# Patient Record
Sex: Female | Born: 1955 | Hispanic: No | Marital: Single | Smoking: Former smoker
Health system: Southern US, Community
[De-identification: ages and names within clinical notes are randomized; demographics above are authoritative.]

## PROBLEM LIST (undated history)

## (undated) DIAGNOSIS — E119 Type 2 diabetes mellitus without complications: Secondary | ICD-10-CM

## (undated) DIAGNOSIS — E039 Hypothyroidism, unspecified: Secondary | ICD-10-CM

## (undated) DIAGNOSIS — M1990 Unspecified osteoarthritis, unspecified site: Secondary | ICD-10-CM

## (undated) HISTORY — PX: TOTAL HIP ARTHROPLASTY: SHX124

## (undated) HISTORY — PX: TOTAL KNEE ARTHROPLASTY: SHX125

---

## 2017-11-28 ENCOUNTER — Encounter: Payer: Self-pay | Admitting: Emergency Medicine

## 2017-11-28 ENCOUNTER — Observation Stay
Admission: EM | Admit: 2017-11-28 | Discharge: 2017-11-29 | Disposition: A | Discharge status: Home / Self Care | Payer: Medicare Other | Attending: Internal Medicine | Admitting: Internal Medicine

## 2017-11-28 ENCOUNTER — Other Ambulatory Visit: Payer: Self-pay

## 2017-11-28 ENCOUNTER — Emergency Department: Payer: Medicare Other

## 2017-11-28 DIAGNOSIS — Z96653 Presence of artificial knee joint, bilateral: Secondary | ICD-10-CM | POA: Insufficient documentation

## 2017-11-28 DIAGNOSIS — Z79899 Other long term (current) drug therapy: Secondary | ICD-10-CM | POA: Insufficient documentation

## 2017-11-28 DIAGNOSIS — R079 Chest pain, unspecified: Secondary | ICD-10-CM | POA: Diagnosis present

## 2017-11-28 DIAGNOSIS — E1100 Type 2 diabetes mellitus with hyperosmolarity without nonketotic hyperglycemic-hyperosmolar coma (NKHHC): Principal | ICD-10-CM | POA: Diagnosis present

## 2017-11-28 DIAGNOSIS — E1165 Type 2 diabetes mellitus with hyperglycemia: Secondary | ICD-10-CM | POA: Diagnosis not present

## 2017-11-28 DIAGNOSIS — E039 Hypothyroidism, unspecified: Secondary | ICD-10-CM | POA: Diagnosis not present

## 2017-11-28 DIAGNOSIS — Z96643 Presence of artificial hip joint, bilateral: Secondary | ICD-10-CM | POA: Diagnosis not present

## 2017-11-28 DIAGNOSIS — Z794 Long term (current) use of insulin: Secondary | ICD-10-CM | POA: Insufficient documentation

## 2017-11-28 DIAGNOSIS — R739 Hyperglycemia, unspecified: Secondary | ICD-10-CM

## 2017-11-28 DIAGNOSIS — M25461 Effusion, right knee: Secondary | ICD-10-CM | POA: Insufficient documentation

## 2017-11-28 DIAGNOSIS — M1990 Unspecified osteoarthritis, unspecified site: Secondary | ICD-10-CM | POA: Diagnosis not present

## 2017-11-28 DIAGNOSIS — E1142 Type 2 diabetes mellitus with diabetic polyneuropathy: Secondary | ICD-10-CM | POA: Insufficient documentation

## 2017-11-28 HISTORY — DX: Type 2 diabetes mellitus without complications: E11.9

## 2017-11-28 HISTORY — DX: Unspecified osteoarthritis, unspecified site: M19.90

## 2017-11-28 HISTORY — DX: Hypothyroidism, unspecified: E03.9

## 2017-11-28 LAB — CBC WITH DIFFERENTIAL/PLATELET
BASOS PCT: 0 %
Basophils Absolute: 0 10*3/uL (ref 0–0.1)
EOS ABS: 0.2 10*3/uL (ref 0–0.7)
Eosinophils Relative: 3 %
HEMATOCRIT: 35.6 % (ref 35.0–47.0)
Hemoglobin: 11.5 g/dL — ABNORMAL LOW (ref 12.0–16.0)
Lymphocytes Relative: 24 %
Lymphs Abs: 1.7 10*3/uL (ref 1.0–3.6)
MCH: 29.5 pg (ref 26.0–34.0)
MCHC: 32.4 g/dL (ref 32.0–36.0)
MCV: 91.1 fL (ref 80.0–100.0)
MONO ABS: 0.3 10*3/uL (ref 0.2–0.9)
MONOS PCT: 4 %
NEUTROS ABS: 5.1 10*3/uL (ref 1.4–6.5)
Neutrophils Relative %: 69 %
PLATELETS: 349 10*3/uL (ref 150–440)
RBC: 3.91 MIL/uL (ref 3.80–5.20)
RDW: 17.9 % — AB (ref 11.5–14.5)
WBC: 7.3 10*3/uL (ref 3.6–11.0)

## 2017-11-28 LAB — GLUCOSE, CAPILLARY
GLUCOSE-CAPILLARY: 465 mg/dL — AB (ref 70–99)
GLUCOSE-CAPILLARY: 376 mg/dL — AB (ref 70–99)
GLUCOSE-CAPILLARY: 348 mg/dL — AB (ref 70–99)
GLUCOSE-CAPILLARY: 143 mg/dL — AB (ref 70–99)
GLUCOSE-CAPILLARY: 300 mg/dL — AB (ref 70–99)
GLUCOSE-CAPILLARY: 136 mg/dL — AB (ref 70–99)
Glucose-Capillary: >600 mg/dL (ref 70–99)
Glucose-Capillary: 524 mg/dL (ref 70–99)
Glucose-Capillary: 94 mg/dL (ref 70–99)

## 2017-11-28 LAB — BASIC METABOLIC PANEL
ANION GAP: 6 (ref 5–15)
BUN: 14 mg/dL (ref 8–23)
CALCIUM: 8.4 mg/dL — AB (ref 8.9–10.3)
CO2: 20 mmol/L — ABNORMAL LOW (ref 22–32)
Chloride: 107 mmol/L (ref 98–111)
Creatinine, Ser: 0.53 mg/dL (ref 0.44–1.00)
Glucose, Bld: 151 mg/dL — ABNORMAL HIGH (ref 70–99)
Potassium: 4.9 mmol/L (ref 3.5–5.1)
SODIUM: 133 mmol/L — AB (ref 135–145)

## 2017-11-28 LAB — URINALYSIS, COMPLETE (UACMP) WITH MICROSCOPIC
BILIRUBIN URINE: NEGATIVE
Bacteria, UA: NONE SEEN
HGB URINE DIPSTICK: NEGATIVE
Ketones, ur: 20 mg/dL — AB
Leukocytes, UA: NEGATIVE
NITRITE: NEGATIVE
PH: 7 (ref 5.0–8.0)
Protein, ur: NEGATIVE mg/dL
SPECIFIC GRAVITY, URINE: 1.018 (ref 1.005–1.030)

## 2017-11-28 LAB — COMPREHENSIVE METABOLIC PANEL
ALBUMIN: 4 g/dL (ref 3.5–5.0)
ALK PHOS: 212 U/L — AB (ref 38–126)
ALT: 21 U/L (ref 0–44)
ANION GAP: 12 (ref 5–15)
AST: 21 U/L (ref 15–41)
BILIRUBIN TOTAL: 1.4 mg/dL — AB (ref 0.3–1.2)
BUN: 22 mg/dL (ref 8–23)
CALCIUM: 9 mg/dL (ref 8.9–10.3)
CO2: 21 mmol/L — ABNORMAL LOW (ref 22–32)
CREATININE: 0.81 mg/dL (ref 0.44–1.00)
Chloride: 95 mmol/L — ABNORMAL LOW (ref 98–111)
GFR calc non Af Amer: >60 mL/min (ref >60)
GLUCOSE: 749 mg/dL — AB (ref 70–99)
Potassium: 4.7 mmol/L (ref 3.5–5.1)
Sodium: 128 mmol/L — ABNORMAL LOW (ref 135–145)
TOTAL PROTEIN: 7 g/dL (ref 6.5–8.1)

## 2017-11-28 LAB — LIPASE, BLOOD: LIPASE: 39 U/L (ref 11–51)

## 2017-11-28 LAB — BLOOD GAS, VENOUS
Acid-base deficit: 5.5 mmol/L — ABNORMAL HIGH (ref 0.0–2.0)
BICARBONATE: 19.2 mmol/L — AB (ref 20.0–28.0)
O2 SAT: 89.9 %
PATIENT TEMPERATURE: 37
PO2 VEN: 61 mmHg — AB (ref 32.0–45.0)
pCO2, Ven: 34 mmHg — ABNORMAL LOW (ref 44.0–60.0)
pH, Ven: 7.36 (ref 7.250–7.430)

## 2017-11-28 LAB — TROPONIN I
Troponin I: <0.03 ng/mL (ref <0.03)
Troponin I: 0.03 ng/mL (ref <0.03)

## 2017-11-28 LAB — MRSA PCR SCREENING: MRSA by PCR: NEGATIVE

## 2017-11-28 LAB — HEMOGLOBIN A1C
Hgb A1c MFr Bld: 9.5 % — ABNORMAL HIGH (ref 4.8–5.6)
MEAN PLASMA GLUCOSE: 225.95 mg/dL

## 2017-11-28 MED ORDER — INSULIN ASPART 100 UNIT/ML ~~LOC~~ SOLN
10.0000 [IU] | Freq: Once | SUBCUTANEOUS | Status: AC
  Administered 2017-11-28: 10 [IU] via INTRAVENOUS
  Filled 2017-11-28: qty 1

## 2017-11-28 MED ORDER — LEVOTHYROXINE SODIUM 150 MCG PO TABS
150.0000 ug | ORAL_TABLET | Freq: Every day | ORAL | Status: DC
Start: 2017-11-28 — End: 2017-11-29
  Administered 2017-11-29: 150 ug via ORAL
  Filled 2017-11-28 (×2): qty 1
  Filled 2017-11-28: qty 3

## 2017-11-28 MED ORDER — DEXTROSE 50 % IV SOLN: 25.0000 mL | INTRAVENOUS | Status: DC | PRN

## 2017-11-28 MED ORDER — ALPRAZOLAM 0.25 MG PO TABS
0.2500 mg | ORAL_TABLET | Freq: Every evening | ORAL | Status: DC | PRN
Start: 2017-11-28 — End: 2017-11-29
  Administered 2017-11-28: 0.25 mg via ORAL
  Filled 2017-11-28: qty 1

## 2017-11-28 MED ORDER — SODIUM CHLORIDE 0.9 % IV SOLN
INTRAVENOUS | Status: DC
  Administered 2017-11-28: 09:00:00 via INTRAVENOUS

## 2017-11-28 MED ORDER — INSULIN REGULAR HUMAN 100 UNIT/ML IJ SOLN
INTRAMUSCULAR | Status: DC
  Administered 2017-11-28: 0.8 [IU]/h via INTRAVENOUS
  Administered 2017-11-28: 4.6 [IU]/h via INTRAVENOUS
  Filled 2017-11-28: qty 1

## 2017-11-28 MED ORDER — ASPIRIN 325 MG PO TABS
325.0000 mg | ORAL_TABLET | Freq: Every day | ORAL | Status: DC
  Administered 2017-11-28 – 2017-11-29 (×2): 325 mg via ORAL
  Filled 2017-11-28 (×2): qty 1

## 2017-11-28 MED ORDER — ENOXAPARIN SODIUM 40 MG/0.4ML ~~LOC~~ SOLN
40.0000 mg | SUBCUTANEOUS | Status: DC
  Administered 2017-11-28: 40 mg via SUBCUTANEOUS
  Filled 2017-11-28: qty 0.4

## 2017-11-28 MED ORDER — MORPHINE SULFATE (PF) 4 MG/ML IV SOLN
4.0000 mg | Freq: Once | INTRAVENOUS | Status: AC
Start: 2017-11-28 — End: 2017-11-28
  Administered 2017-11-28: 4 mg via INTRAVENOUS
  Filled 2017-11-28: qty 1

## 2017-11-28 MED ORDER — OXYCODONE-ACETAMINOPHEN 7.5-325 MG PO TABS
1.0000 | ORAL_TABLET | ORAL | Status: DC | PRN
  Administered 2017-11-28 – 2017-11-29 (×3): 1 via ORAL
  Filled 2017-11-28 (×3): qty 1

## 2017-11-28 MED ORDER — SODIUM CHLORIDE 0.9 % IV SOLN: INTRAVENOUS | Status: DC

## 2017-11-28 MED ORDER — ACETAMINOPHEN 650 MG RE SUPP: 650.0000 mg | Freq: Four times a day (QID) | RECTAL | Status: DC | PRN

## 2017-11-28 MED ORDER — INSULIN GLARGINE 100 UNIT/ML ~~LOC~~ SOLN
33.0000 [IU] | Freq: Every day | SUBCUTANEOUS | Status: DC
Start: 2017-11-28 — End: 2017-11-29
  Administered 2017-11-28 – 2017-11-29 (×2): 33 [IU] via SUBCUTANEOUS
  Filled 2017-11-28 (×3): qty 0.33

## 2017-11-28 MED ORDER — ONDANSETRON HCL 4 MG/2ML IJ SOLN: 4.0000 mg | Freq: Four times a day (QID) | INTRAMUSCULAR | Status: DC | PRN

## 2017-11-28 MED ORDER — SODIUM CHLORIDE 0.9 % IV BOLUS
1000.0000 mL | Freq: Once | INTRAVENOUS | Status: AC
  Administered 2017-11-28: 1000 mL via INTRAVENOUS

## 2017-11-28 MED ORDER — BASAGLAR KWIKPEN 100 UNIT/ML ~~LOC~~ SOPN: 33.0000 [IU] | PEN_INJECTOR | Freq: Every day | SUBCUTANEOUS | Status: DC

## 2017-11-28 MED ORDER — PRAMIPEXOLE DIHYDROCHLORIDE 0.25 MG PO TABS
0.7500 mg | ORAL_TABLET | Freq: Two times a day (BID) | ORAL | Status: DC
  Administered 2017-11-28 – 2017-11-29 (×2): 0.75 mg via ORAL
  Filled 2017-11-28 (×2): qty 3

## 2017-11-28 MED ORDER — HYDROCODONE-ACETAMINOPHEN 5-325 MG PO TABS
1.0000 | ORAL_TABLET | ORAL | Status: DC | PRN
  Administered 2017-11-28 – 2017-11-29 (×2): 2 via ORAL
  Filled 2017-11-28 (×2): qty 2

## 2017-11-28 MED ORDER — ATORVASTATIN CALCIUM 20 MG PO TABS: 40.0000 mg | ORAL_TABLET | Freq: Every day | ORAL | Status: DC

## 2017-11-28 MED ORDER — INSULIN REGULAR BOLUS VIA INFUSION
0.0000 [IU] | Freq: Three times a day (TID) | INTRAVENOUS | Status: DC
  Administered 2017-11-28: 7.4 [IU] via INTRAVENOUS
  Filled 2017-11-28: qty 10

## 2017-11-28 MED ORDER — MORPHINE SULFATE (PF) 4 MG/ML IV SOLN
4.0000 mg | INTRAVENOUS | Status: DC | PRN
  Administered 2017-11-28: 4 mg via INTRAVENOUS
  Filled 2017-11-28: qty 1

## 2017-11-28 MED ORDER — INSULIN ASPART 100 UNIT/ML ~~LOC~~ SOLN
0.0000 [IU] | Freq: Three times a day (TID) | SUBCUTANEOUS | Status: DC
  Administered 2017-11-28: 11 [IU] via SUBCUTANEOUS
  Administered 2017-11-29: 3 [IU] via SUBCUTANEOUS
  Filled 2017-11-28 (×2): qty 1

## 2017-11-28 MED ORDER — INSULIN ASPART 100 UNIT/ML ~~LOC~~ SOLN
0.0000 [IU] | Freq: Every day | SUBCUTANEOUS | Status: DC
Start: 2017-11-28 — End: 2017-11-29

## 2017-11-28 MED ORDER — DEXTROSE-NACL 5-0.45 % IV SOLN: INTRAVENOUS | Status: DC

## 2017-11-28 MED ORDER — ONDANSETRON HCL 4 MG/2ML IJ SOLN
4.0000 mg | Freq: Once | INTRAMUSCULAR | Status: AC
  Administered 2017-11-28: 4 mg via INTRAVENOUS
  Filled 2017-11-28: qty 2

## 2017-11-28 MED ORDER — SENNOSIDES-DOCUSATE SODIUM 8.6-50 MG PO TABS: 1.0000 | ORAL_TABLET | Freq: Every evening | ORAL | Status: DC | PRN

## 2017-11-28 MED ORDER — ONDANSETRON HCL 4 MG PO TABS
4.0000 mg | ORAL_TABLET | Freq: Four times a day (QID) | ORAL | Status: DC | PRN
  Administered 2017-11-28: 4 mg via ORAL
  Filled 2017-11-28: qty 1

## 2017-11-28 MED ORDER — ACETAMINOPHEN 325 MG PO TABS: 650.0000 mg | ORAL_TABLET | Freq: Four times a day (QID) | ORAL | Status: DC | PRN

## 2017-11-28 MED ORDER — BISACODYL 5 MG PO TBEC: 5.0000 mg | DELAYED_RELEASE_TABLET | Freq: Every day | ORAL | Status: DC | PRN

## 2017-11-28 MED ORDER — ALBUTEROL SULFATE (2.5 MG/3ML) 0.083% IN NEBU: 2.5000 mg | INHALATION_SOLUTION | RESPIRATORY_TRACT | Status: DC | PRN

## 2017-11-28 NOTE — Care Management Note (Addendum)
Case Management Note  Patient Details  Name: Deanna Hartman MRN: [id number] Date of Birth: December 03, 1955  Subjective/Objective:                  RNCM met with patient as she is listed not having a PCP. She states she has recently moved from [place] to [place] with her sister.  Her PCP is in [place] and she has been able to obtain her medications without difficulty. She uses [facility] for pharmacy. She requests without preference a new PCP in [place].  FNP Philis Nettle will see patient at [facility] care on [address] On Tuesday 12/05/17 at 0845AM. Patient agrees and thanked CM for assistance. She states her sister Deanna Hartman will provide transportation for her until she can re-new her driver's license on 12/25 appointment at [facility]. She walks with a cane at baseline.  Supplemental O2 is acute. She has a working Surveyor, minerals.  Action/Plan: RNCM to follow.     Expected Discharge Date:                  Expected Discharge Plan:     In-House Referral:     Discharge planning Services     Post Acute Care Choice:    Choice offered to:     DME Arranged:    DME Agency:     HH Arranged:    HH Agency:     Status of Service:     If discussed at [employer], dates discussed:    Additional Comments:  Marshell Garfinkel, RN 11/28/2017, 10:50 AM

## 2017-11-28 NOTE — Care Management (Signed)
   Readmission Risk Prevention Plan 11/28/2017  Post Dischage Appt Complete  Medication Screening Complete  Transportation Screening Complete  PCP follow-up Complete

## 2017-11-28 NOTE — Consult Note (Signed)
Name: Deanna LordsRebecca Hartman MRN: [id number] DOB: 02/20/1956    ADMISSION DATE:  11/28/2017 CONSULTATION DATE:  11/28/2017  REFERRING MD :  Dr. Imogene Burnhen  CHIEF COMPLAINT:  Hyperglycemia, Chest/Abdominal pain  BRIEF PATIENT DESCRIPTION:  62 y.o. Female with Hyperosmolar nonketotic state   SIGNIFICANT EVENTS  11/28/17>>Admission to [facility] Stepdown unit  STUDIES:  CXR 8/13>> Shallow inspiration with linear atelectasis in the mid lungs.   HISTORY OF PRESENT ILLNESS:   Deanna Hartman is a 62 year old female past medical history as listed below, who presented to [facility] ED on 11/28/17 with complaints of chest and abdominal pain, and hyperglycemia.  She has recently moved from [place] and has not been set up with a PCP or Endocrinologist at this point.  She reports gradual onset of chest and abdominal pain for 3 to 4 days, which she says has experienced before when her sugars are elevated.  She also had complaints of urinary frequency, polyuria, and polydipsia. Initial  work-up in the ED revealed glucose 749, venous pH 7.36, CO2 34, bicarb 19.2, and Anion gap 12.  CXR with atelectasis of bilateral mid lungs.  UA with 20 ketones, no signs of UTI. Troponin has been negative x2. She received 2L NS bolus in ED. She is [facility] Step[facility] unit for treatment of Hyperosmolar nonketotic state.  PCCM is consulted for further management.  PAST MEDICAL HISTORY :   has a past medical history of Arthritis, Diabetes mellitus without complication (HCC), and Hypothyroidism.  has a past surgical history that includes Replacement total knee bilateral and Total hip arthroplasty (Bilateral). Prior to Admission medications   Medication Sig Start Date End Date Taking? Authorizing Provider  ALPRAZolam (XANAX) 0.25 MG tablet Take 0.25 mg by mouth at bedtime.    Yes [provider]  furosemide (LASIX) 40 MG tablet Take 40 mg by mouth daily.   Yes [provider]  GABAPENTIN PO Take 1-2 tablets by mouth 2 (two)  times daily. Take 1 tablet by mouth in the morning and 2 at bedtime.   Yes [provider]  insulin aspart (NOVOLOG FLEXPEN) 100 UNIT/ML FlexPen Inject 3-20 Units into the skin 3 (three) times daily with meals. Sliding scale   Yes [provider]  Insulin Glargine (BASAGLAR KWIKPEN) 100 UNIT/ML SOPN Inject 33 Units into the skin at bedtime.   Yes [provider]  levothyroxine (SYNTHROID, LEVOTHROID) 150 MCG tablet Take 150 mcg by mouth daily.    Yes [provider]  pioglitazone (ACTOS) 30 MG tablet Take 30 mg by mouth daily.    Yes [provider]  pramipexole (MIRAPEX) 0.75 MG tablet Take 1-2 tablets by mouth 2 (two) times daily. Take 1 tablet by mouth in the morning and 2 at bedtime.    Yes [provider]   No Known Allergies  FAMILY HISTORY:  family history is not on file. She was adopted. SOCIAL HISTORY:  reports that she has never smoked. She has never used smokeless tobacco. She reports that she drank alcohol. She reports that she does not use drugs.  REVIEW OF SYSTEMS:  Positives in [place] Constitutional: Negative for fever, chills, weight loss, malaise/fatigue and diaphoresis.  HENT: Negative for hearing loss, ear pain, nosebleeds, congestion, sore throat, neck pain, tinnitus and ear discharge.   Eyes: Negative for blurred vision, double vision, photophobia, pain, discharge and redness.  Respiratory: Negative for cough, hemoptysis, sputum production, +shortness of breath, wheezing and stridor.   Cardiovascular: Negative for chest pain, palpitations, orthopnea, claudication, leg swelling  and PND.  Gastrointestinal: Negative for heartburn, nausea, vomiting, abdominal pain, diarrhea, constipation, blood in stool and melena.  Genitourinary: Negative for dysuria, urgency, frequency, hematuria and flank pain.  Musculoskeletal: Negative for myalgias, back pain, joint pain and falls.  Skin: Negative for itching and rash.  Neurological:  Negative for dizziness, tingling, tremors, sensory change, speech change, focal weakness, seizures, loss of consciousness, weakness and headaches.  Endo/Heme/Allergies: Negative for environmental allergies and polydipsia. Does not bruise/bleed easily.  SUBJECTIVE:  Reports she is feeling much better, reports slight SOB Denies chest pain, N/V On Room air IV insulin and NS infusions  VITAL SIGNS: Temp:  [98 F (36.7 C)-98.4 F (36.9 C)] 98.4 F (36.9 C) (08/13 0858) Pulse Rate:  [96-112] 109 (08/13 1100) Resp:  [17-24] 17 (08/13 1100) BP: (132-160)/(65-75) 148/71 (08/13 0858) SpO2:  [100 %] 100 % (08/13 0830) Weight:  [71.7 kg-75.5 kg] 75.5 kg (08/13 0858)  PHYSICAL EXAMINATION: General:  Acutely ill appearing female, laying in bed, on room air, in NAD Neuro:  Awake, A&O x4, Follows commands, no focal deficits HEENT:  Atraumatic, normocephalic, neck supple, no JVD Cardiovascular:  RRR, s1s2, no M/R/G, 2+ pulses throughout Lungs:  Clear bilaterally, even, non-labored, normal effort Abdomen:  Soft, non-tender, non-distended, BS+ x4 Musculoskeletal:  No deformities Skin:  Warm, dry.  No obvious rashes, lesions, or ulcerations  Recent Labs  Lab 11/28/17 0628  NA 128*  K 4.7  CL 95*  CO2 21*  BUN 22  CREATININE 0.81  GLUCOSE 749*   Recent Labs  Lab 11/28/17 0628  HGB 11.5*  HCT 35.6  WBC 7.3  PLT 349   Dg Chest Portable 1 View  Result Date: 11/28/2017 CLINICAL DATA:  High blood sugar and abdominal pain. EXAM: PORTABLE CHEST 1 VIEW COMPARISON:  None. FINDINGS: Shallow inspiration. Linear atelectasis in the mid lungs. No consolidation or airspace disease. No blunting of costophrenic angles. No pneumothorax. Normal heart size and pulmonary vascularity. Calcification of the aorta. IMPRESSION: Shallow inspiration with linear atelectasis in the mid lungs. Electronically Signed   By: Deanna NievesWilliam  Hartman M.D.   On: 11/28/2017 06:39    ASSESSMENT / PLAN:  A: Hyperosmolar  nonketotic state in setting of DM II Chest pain Hx:DM II, hypothyroidism, arthritis P: Aggressive IVF IV insulin gtt Keep NPO Cardiac monitoring Trend Troponin Continue ASA and Lipitor Needs to be set up with Endocrinology outpatient Continue home synthroid   Updates: Updated pt at bedside 11/28/17. Disposition: Stepdown VTE Prophylaxis: Lovenox SUP: n/a   Harlon DittyJeremiah Chareese Sergent, AGACNP-BC [facility] Pager: [phone]   11/28/2017, 11:30 AM

## 2017-11-28 NOTE — H&P (Addendum)
[facility] [place] [facility]   PATIENT NAME: Deanna LordsRebecca Hartman    MR#:  [id number]  DATE OF BIRTH:  11/22/1955  DATE OF ADMISSION:  11/28/2017  PRIMARY CARE PHYSICIAN: Patient, No Pcp Per   REQUESTING/REFERRING PHYSICIAN: Dionne BucySiadecki, Sebastian, MD  CHIEF COMPLAINT:   Chief Complaint  Patient presents with  . Hyperglycemia  . Abdominal Pain   Abdominal pain and hyperglycemia. HISTORY OF PRESENT ILLNESS:  Deanna Hartman  is a 62 y.o. female with a known history of diabetes, DKA, arthritis and hypothyroidism.  The patient complains of gradual onset of hypoglycemia and abdominal pain for 1 to 2 days.  He also complains of chest pain, which is aching, intermittent and radiation to left shoulder.  She also complains of urinary frequency, polyuria polydipsia.  She said she had similar symptoms during her DKA episode.  She was found hypoglycemia with glucose at 749.  But anion gap is 12.  She is being treated with normal saline bolus and given NovoLog 2 units IV just now.  PAST MEDICAL HISTORY:   Past Medical History:  Diagnosis Date  . Arthritis   . Diabetes mellitus without complication (HCC)   . Hypothyroidism     PAST SURGICAL HISTORY:   Past Surgical History:  Procedure Laterality Date  . REPLACEMENT TOTAL KNEE BILATERAL    . TOTAL HIP ARTHROPLASTY Bilateral     SOCIAL HISTORY:   Social History   Tobacco Use  . Smoking status: Never Smoker  . Smokeless tobacco: Never Used  Substance Use Topics  . Alcohol use: Not Currently    Frequency: Never    FAMILY HISTORY:   Family History  Adopted: Yes    DRUG ALLERGIES:  No Known Allergies  REVIEW OF SYSTEMS:   Review of Systems  Constitutional: Positive for malaise/fatigue. Negative for chills and fever.  HENT: Negative for sore throat.   Eyes: Negative for blurred vision and double vision.  Respiratory: Negative for cough, hemoptysis, shortness of breath, wheezing and stridor.     Cardiovascular: Positive for chest pain. Negative for palpitations, orthopnea and leg swelling.  Gastrointestinal: Positive for abdominal pain and nausea. Negative for blood in stool, diarrhea, melena and vomiting.  Genitourinary: Positive for frequency. Negative for dysuria, flank pain and hematuria.  Musculoskeletal: Negative for back pain and joint pain.  Skin: Negative for rash.  Neurological: Negative for dizziness, sensory change, focal weakness, seizures, loss of consciousness, weakness and headaches.  Endo/Heme/Allergies: Positive for polydipsia.  Psychiatric/Behavioral: Negative for depression. The patient is nervous/anxious.     MEDICATIONS AT HOME:   Prior to Admission medications   Not on File      VITAL SIGNS:  Blood pressure (!) 157/73, pulse (!) 112, temperature 98 F (36.7 C), temperature source Oral, resp. rate 20, height 5\' 3"  (1.6 m), weight 71.7 kg, SpO2 100 %.  PHYSICAL EXAMINATION:  Physical Exam  GENERAL:  62 y.o.-year-old patient lying in the bed with no acute distress.  EYES: Pupils equal, round, reactive to light and accommodation. No scleral icterus. Extraocular muscles intact.  HEENT: Head atraumatic, normocephalic. Oropharynx and nasopharynx clear.  NECK:  Supple, no jugular venous distention. No thyroid enlargement, no tenderness.  LUNGS: Normal breath sounds bilaterally, no wheezing, rales,rhonchi or crepitation. No use of accessory muscles of respiration.  CARDIOVASCULAR: S1, S2 normal. No murmurs, rubs, or gallops.  ABDOMEN: Soft, nontender, nondistended. Bowel sounds present. No organomegaly or mass.  EXTREMITIES: No pedal edema, cyanosis, or clubbing.  NEUROLOGIC: Cranial nerves  II through XII are intact. Muscle strength 5/5 in all extremities. Sensation intact. Gait not checked.  PSYCHIATRIC: The patient is alert and oriented x 3.  SKIN: No obvious rash, lesion, or ulcer.   LABORATORY PANEL:   CBC Recent Labs  Lab 11/28/17 0628  WBC 7.3   HGB 11.5*  HCT 35.6  PLT 349   ------------------------------------------------------------------------------------------------------------------  Chemistries  Recent Labs  Lab 11/28/17 0628  NA 128*  K 4.7  CL 95*  CO2 21*  GLUCOSE 749*  BUN 22  CREATININE 0.81  CALCIUM 9.0  AST 21  ALT 21  ALKPHOS 212*  BILITOT 1.4*   ------------------------------------------------------------------------------------------------------------------  Cardiac Enzymes Recent Labs  Lab 11/28/17 0628  TROPONINI <0.03   ------------------------------------------------------------------------------------------------------------------  RADIOLOGY:  Dg Chest Portable 1 View  Result Date: 11/28/2017 CLINICAL DATA:  High blood sugar and abdominal pain. EXAM: PORTABLE CHEST 1 VIEW COMPARISON:  None. FINDINGS: Shallow inspiration. Linear atelectasis in the mid lungs. No consolidation or airspace disease. No blunting of costophrenic angles. No pneumothorax. Normal heart size and pulmonary vascularity. Calcification of the aorta. IMPRESSION: Shallow inspiration with linear atelectasis in the mid lungs. Electronically Signed   By: Burman NievesWilliam  Stevens M.D.   On: 11/28/2017 06:39      IMPRESSION AND PLAN:   Hyperosmolar nonketotic state with DM T2. The patient will be admitted to stepdown unit. Start insulin drip, n.p.o. except medication, follow up intensivist. Resume diabetes diet on the Lantus once blood sugar is controlled.  Chest pain.  Follow-up troponin level, aspirin and Lipitor, check lipid panel.  Discussed with  Dr. Sung AmabileSimonds. All the records are reviewed and case discussed with ED provider. Management plans discussed with the patient, family and they are in agreement.  CODE STATUS: Full code  TOTAL CRITICAL TIME TAKING CARE OF THIS PATIENT: 46 minutes.    Shaune PollackQing Josely Moffat M.D on 11/28/2017 at 7:23 AM  Between 7am to 6pm - Pager - [phone]  After 6pm go to [email or web address] - [email or web address]  [facility]  [facility]  Office  [phone]  CC: Primary care physician; Patient, No Pcp Per   Note: This dictation was prepared with Dragon dictation along with smaller phrase technology. Any transcriptional errors that result from this process are unin

## 2017-11-28 NOTE — Significant Event (Signed)
Off insulin gtt. Transfer to MedSurg floor with cardiac monitoring. After transfer, PCCM will sign off. Please call if we can be of further assistance    Billy Fischeravid Simonds, MD PCCM service Mobile [phone] Pager [phone] 11/28/2017 3:47 PM

## 2017-11-28 NOTE — Progress Notes (Signed)
Pt cbg 94, glucostablizer states to hold insulin gtt. NP notified to order lantus. Will carry out orders and continue to monitor.

## 2017-11-28 NOTE — ED Triage Notes (Signed)
Pt arrived to the ED from home for complaints of having high BS and abdominal pain. Pt reports that she believes that she is in DKA. Pt is AOx4 in moderate pain.
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
## 2017-11-28 NOTE — Progress Notes (Signed)
Inpatient Diabetes Program Recommendations  AACE/ADA: New Consensus Statement on Inpatient Glycemic Control (2015)  Target Ranges:  Prepandial:   less than 140 mg/dL      Peak postprandial:   less than 180 mg/dL (1-2 hours)      Critically ill patients:  140 - 180 mg/dL   Results for Deanna Hartman, Deanna Hartman (MRN [id number]) as of 11/28/2017 09:06  Ref. Range 11/28/2017 06:24 11/28/2017 08:00 11/28/2017 08:59  Glucose-Capillary Latest Ref Range: 70 - 99 mg/dL >295>600 (HH)  10 units Novolog  465 (H) 376 (H)    Admit: Hyperglycemia/ Abd Pain  History: DM  Home DM Meds: Basaglar 33 units QHS        Novolog 3-20 units TID per SSI       Actos 30 mg daily  Current Orders: IV Insulin Drip       Patient admitted with glucose 749 mg/dl (CO2 was 21 and Anion Gap was 12 on 6:30am BMET).  Given NS IVF boluses in the ED and IV Insulin drip to start this AM.  Current Hemoglobin A1c level pending.     --Will follow patient during hospitalization--  Ambrose FinlandJeannine Johnston Salah Nakamura RN, MSN, CDE Diabetes Coordinator Inpatient Glycemic Control Team Team Pager: [phone] (8a-5p)

## 2017-11-29 DIAGNOSIS — R739 Hyperglycemia, unspecified: Secondary | ICD-10-CM | POA: Diagnosis present

## 2017-11-29 LAB — GLUCOSE, CAPILLARY
GLUCOSE-CAPILLARY: 121 mg/dL — AB (ref 70–99)
GLUCOSE-CAPILLARY: 186 mg/dL — AB (ref 70–99)

## 2017-11-29 LAB — BASIC METABOLIC PANEL
ANION GAP: 8 (ref 5–15)
BUN: 14 mg/dL (ref 8–23)
CO2: 25 mmol/L (ref 22–32)
CREATININE: 0.6 mg/dL (ref 0.44–1.00)
Calcium: 8.7 mg/dL — ABNORMAL LOW (ref 8.9–10.3)
Chloride: 104 mmol/L (ref 98–111)
GLUCOSE: 171 mg/dL — AB (ref 70–99)
Potassium: 4.5 mmol/L (ref 3.5–5.1)
Sodium: 137 mmol/L (ref 135–145)

## 2017-11-29 LAB — CBC
HCT: 31.8 % — ABNORMAL LOW (ref 35.0–47.0)
HEMOGLOBIN: 10.5 g/dL — AB (ref 12.0–16.0)
MCH: 29.2 pg (ref 26.0–34.0)
MCHC: 33.1 g/dL (ref 32.0–36.0)
MCV: 88.3 fL (ref 80.0–100.0)
PLATELETS: 329 10*3/uL (ref 150–440)
RBC: 3.6 MIL/uL — ABNORMAL LOW (ref 3.80–5.20)
RDW: 16.8 % — ABNORMAL HIGH (ref 11.5–14.5)
WBC: 4.4 10*3/uL (ref 3.6–11.0)

## 2017-11-29 LAB — LIPID PANEL
Cholesterol: 202 mg/dL — ABNORMAL HIGH (ref 0–200)
HDL: 98 mg/dL (ref >40)
LDL Cholesterol: 66 mg/dL (ref 0–99)
TRIGLYCERIDES: 190 mg/dL — AB (ref <150)
Total CHOL/HDL Ratio: 2.1 RATIO
VLDL: 38 mg/dL (ref 0–40)

## 2017-11-29 LAB — TROPONIN I: Troponin I: <0.03 ng/mL (ref <0.03)

## 2017-11-29 LAB — HIV ANTIBODY (ROUTINE TESTING W REFLEX): HIV SCREEN 4TH GENERATION: NONREACTIVE

## 2017-11-29 MED ORDER — TRAMADOL HCL 50 MG PO TABS: 50.0000 mg | ORAL_TABLET | Freq: Four times a day (QID) | ORAL | 0 refills | Status: DC | PRN

## 2017-11-29 MED ORDER — FUROSEMIDE 40 MG PO TABS: 40.0000 mg | ORAL_TABLET | Freq: Every day | ORAL | 0 refills | Status: AC | PRN

## 2017-11-29 MED ORDER — BASAGLAR KWIKPEN 100 UNIT/ML ~~LOC~~ SOPN: 33.0000 [IU] | PEN_INJECTOR | Freq: Every day | SUBCUTANEOUS | 1 refills | Status: DC

## 2017-11-29 NOTE — Care Management CC44 (Signed)
Condition Code 44 Documentation Completed  Patient Details  Name: Dara LordsRebecca Galindez MRN: [id number] Date of Birth: 01/25/1956   Condition Code 44 given:  Yes Patient signature on Condition Code 44 notice:  Yes Documentation of 2 MD's agreement:  Yes Code 44 added to claim:  Yes    Eber HongGreene, Nadezhda Pollitt R, RN 11/29/2017, 9:12 AM

## 2017-11-29 NOTE — Discharge Summary (Signed)
[facility] - [place] at [facility]   PATIENT NAME: Deanna Hartman    MR#:  [id number]  DATE OF BIRTH:  12/25/1955  DATE OF ADMISSION:  11/28/2017   ADMITTING PHYSICIAN: Shaune PollackQing Chen, MD  DATE OF DISCHARGE: 11/29/2017 10:02 AM  PRIMARY CARE PHYSICIAN: Patient, No Pcp Per   ADMISSION DIAGNOSIS:   Hyperglycemia [R73.9] Chest pain, unspecified type [R07.9]  DISCHARGE DIAGNOSIS:   Active Problems:   Hyperosmolar non-ketotic state in patient with type 2 diabetes mellitus (HCC)   Hyperglycemia   SECONDARY DIAGNOSIS:   Past Medical History:  Diagnosis Date  . Arthritis   . Diabetes mellitus without complication (HCC)   . Hypothyroidism     HOSPITAL COURSE:   62 year old female with past medical history significant for diabetes mellitus, osteoarthritis and hypothyroidism was admitted to hospital secondary to DKA  1.  Diabetic ketoacidosis-blood sugars greater than 700 on admission.  Patient states she lost her wallet including her basal insulin about a month ago.  [place] ago.  Has been only using her NovoLog insulin.  Anion gap was 12 -  placed on insulin drip and was in ICU. -A1c is 9.5 -Restarted on Lantus FlexPen 33 units and continue NovoLog per sliding scale.  Strongly recommended to establish a physician especially an endocrinologist -Patient also on Actos at home.  2.  Hypothyroidism-continue Synthroid  3.  Osteoarthritis-status post left knee TKR, now right knee is swollen and has been giving her problems for a while.  Recommended outpatient Ortho follow-up  4.  Peripheral neuropathy-on gabapentin  Patient has been able to ambulate with a cane at baseline.  Will be discharged home.   DISCHARGE CONDITIONS:   Guarded  CONSULTS OBTAINED:   Treatment Team:  Enid BaasKalisetti, Dahl Higinbotham, MD  DRUG ALLERGIES:   No Known Allergies DISCHARGE MEDICATIONS:   Allergies as of 11/29/2017   No Known  Allergies     Medication List    TAKE these medications   ACTOS 30 MG tablet Generic drug:  pioglitazone Take 30 mg by mouth daily.   ALPRAZolam 0.25 MG tablet Commonly known as:  XANAX Take 0.25 mg by mouth at bedtime.   BASAGLAR KWIKPEN 100 UNIT/ML Sopn Inject 0.33 mLs (33 Units total) into the skin at bedtime.   furosemide 40 MG tablet Commonly known as:  LASIX Take 1 tablet (40 mg total) by mouth daily as needed. What changed:    when to take this  reasons to take this   GABAPENTIN PO Take 1-2 tablets by mouth 2 (two) times daily. Take 1 tablet by mouth in the morning and 2 at bedtime.   levothyroxine 150 MCG tablet Commonly known as:  SYNTHROID, LEVOTHROID Take 150 mcg by mouth daily.   NOVOLOG FLEXPEN 100 UNIT/ML FlexPen Generic drug:  insulin aspart Inject 3-20 Units into the skin 3 (three) times daily with meals. Sliding scale   pramipexole 0.75 MG tablet Commonly known as:  MIRAPEX Take 1-2 tablets by mouth 2 (two) times daily. Take 1 tablet by mouth in the morning and 2 at bedtime.   traMADol 50 MG tablet Commonly known as:  ULTRAM Take 1 tablet (50 mg total) by mouth every 6 (six) hours as needed for up to 5 days.        DISCHARGE INSTRUCTIONS:   1. Endocrinology f/u in 1 week 2. PCP f/u in 1 week 3. Orthopedics f/u in 2-3 weeks for right knee osteoarthritis  DIET:  Cardiac diet and Diabetic diet  ACTIVITY:   Activity as tolerated  OXYGEN:   Home Oxygen: No.  Oxygen Delivery: room air  DISCHARGE LOCATION:   home   If you experience worsening of your admission symptoms, develop shortness of breath, life threatening emergency, suicidal or homicidal thoughts you must seek medical attention immediately by calling 911 or calling your MD immediately  if symptoms less severe.  You Must read complete instructions/literature along with all the possible adverse reactions/side effects for all the Medicines you take and that have been  prescribed to you. Take any new Medicines after you have completely understood and accpet all the possible adverse reactions/side effects.   Please note  You were cared for by a hospitalist during your hospital stay. If you have any questions about your discharge medications or the care you received while you were in the hospital after you are discharged, you can call the unit and asked to speak with the hospitalist on call if the hospitalist that took care of you is not available. Once you are discharged, your primary care physician will handle any further medical issues. Please note that NO REFILLS for any discharge medications will be authorized once you are discharged, as it is imperative that you return to your primary care physician (or establish a relationship with a primary care physician if you do not have one) for your aftercare needs so that they can reassess your need for medications and monitor your lab values.    On the day of Discharge:  VITAL SIGNS:   Blood pressure 109/60, pulse 80, temperature 98.4 F (36.9 C), temperature source Oral, resp. rate 18, height 5\' 3"  (1.6 m), weight 75.5 kg, SpO2 96 %.  PHYSICAL EXAMINATION:    GENERAL:  62 y.o.-year-old patient lying in the bed with no acute distress.  EYES: Pupils equal, round, reactive to light and accommodation. No scleral icterus. Extraocular muscles intact.  HEENT: Head atraumatic, normocephalic. Oropharynx and nasopharynx clear.  NECK:  Supple, no jugular venous distention. No thyroid enlargement, no tenderness.  LUNGS: Normal breath sounds bilaterally, no wheezing, rales,rhonchi or crepitation. No use of accessory muscles of respiration.  Decreased bibasilar breath sounds CARDIOVASCULAR: S1, S2 normal. No rubs, or gallops.  2/6 systolic murmur is present ABDOMEN: Soft, non-tender, non-distended. Bowel sounds present. No organomegaly or mass.  EXTREMITIES: No pedal edema, cyanosis, or clubbing.  Right knee swelling and  tenderness noted NEUROLOGIC: Cranial nerves II through XII are intact. Muscle strength 5/5 in all extremities. Sensation intact. Gait not checked.  PSYCHIATRIC: The patient is alert and oriented x 3.  SKIN: No obvious rash, lesion, or ulcer.   DATA REVIEW:   CBC Recent Labs  Lab 11/29/17 0544  WBC 4.4  HGB 10.5*  HCT 31.8*  PLT 329    Chemistries  Recent Labs  Lab 11/28/17 0628  11/29/17 0544  NA 128*   < > 137  K 4.7   < > 4.5  CL 95*   < > 104  CO2 21*   < > 25  GLUCOSE 749*   < > 171*  BUN 22   < > 14  CREATININE 0.81   < > 0.60  CALCIUM 9.0   < > 8.7*  AST 21  --   --   ALT 21  --   --   ALKPHOS 212*  --   --   BILITOT 1.4*  --   --    < > = values in this  interval not displayed.     Microbiology Results  Results for orders placed or performed during the hospital encounter of 11/28/17  MRSA PCR Screening     Status: None   Collection Time: 11/28/17  7:08 PM  Result Value Ref Range Status   MRSA by PCR NEGATIVE NEGATIVE Final    Comment:        The GeneXpert MRSA Assay (FDA approved for NASAL specimens only), is one component of a comprehensive MRSA colonization surveillance program. It is not intended to diagnose MRSA infection nor to guide or monitor treatment for MRSA infections. Performed at [facility], [address] [place], [place] [address]     RADIOLOGY:  No results found.   Management plans discussed with the patient, family and they are in agreement.  CODE STATUS:  Code Status History    Date Active Date Inactive Code Status Order ID Comments User Context   11/28/2017 0857 11/29/2017 1307 Full Code [id number]  Shaune Pollackhen, Qing, MD Inpatient      TOTAL TIME TAKING CARE OF THIS PATIENT: 38 minutes.    Enid BaasKALISETTI,Takara Sermons M.D on 11/29/2017 at 4:12 PM  Between 7am to 6pm - Pager - [phone]  After 6pm go to [email or web address] - Social research officer, governmentpassword EPAS [email or web address]  [facility] [place] [facility]  Office  [phone]  CC: Primary care  physician; Patient, No Pcp Per   Note: This dictation was prepared with Dragon dictation along with smaller phrase technology. Any transcriptional errors that result from this process are unintentional.

## 2017-11-29 NOTE — Discharge Instructions (Signed)
Hyperglycemia Hyperglycemia occurs when the level of sugar (glucose) in the blood is too high. Glucose is a type of sugar that provides the body's main source of energy. Certain hormones (insulin and glucagon) control the level of glucose in the blood. Insulin lowers blood glucose, and glucagon increases blood glucose. Hyperglycemia can result from having too little insulin in the bloodstream, or from the body not responding normally to insulin. Hyperglycemia occurs most often in people who have diabetes (diabetes mellitus), but it can happen in people who do not have diabetes. It can develop quickly, and it can be life-threatening if it causes you to become severely dehydrated (diabetic ketoacidosis or hyperglycemic hyperosmolar state). Severe hyperglycemia is a medical emergency. What are the causes? If you have diabetes, hyperglycemia may be caused by:  Diabetes medicine.  Medicines that increase blood glucose or affect your diabetes control.  Not eating enough, or not eating often enough.  Changes in physical activity level.  Being sick or having an infection.  If you have prediabetes or undiagnosed diabetes:  Hyperglycemia may be caused by those conditions.  If you do not have diabetes, hyperglycemia may be caused by:  Certain medicines, including steroid medicines, beta-blockers, epinephrine, and thiazide diuretics.  Stress.  Serious illness.  Surgery.  Diseases of the pancreas.  Infection.  What increases the risk? Hyperglycemia is more likely to develop in people who have risk factors for diabetes, such as:  Having a family member with diabetes.  Having a gene for type 1 diabetes that is passed from parent to child (inherited).  Living in an area with cold weather conditions.  Exposure to certain viruses.  Certain conditions in which the body's disease-fighting (immune) system attacks itself (autoimmune disorders).  Being overweight or obese.  Having an  inactive (sedentary) lifestyle.  Having been diagnosed with insulin resistance.  Having a history of prediabetes, gestational diabetes, or polycystic ovarian syndrome (PCOS).  Being of American-Indian, African-American, Hispanic/Latino, or Asian/Pacific Islander descent.  What are the signs or symptoms? Hyperglycemia may not cause any symptoms. If you do have symptoms, they may include early warning signs, such as:  Increased thirst.  Hunger.  Feeling very tired.  Needing to urinate more often than usual.  Blurry vision.  Other symptoms may develop if hyperglycemia gets worse, such as:  Dry mouth.  Loss of appetite.  Fruity-smelling breath.  Weakness.  Unexpected or rapid weight gain or weight loss.  Tingling or numbness in the hands or feet.  Headache.  Skin that does not quickly return to normal after being lightly pinched and released (poor skin turgor).  Abdominal pain.  Cuts or bruises that are slow to heal.  How is this diagnosed? Hyperglycemia is diagnosed with a blood test to measure your blood glucose level. This blood test is usually done while you are having symptoms. Your health care provider may also do a physical exam and review your medical history. You may have more tests to determine the cause of your hyperglycemia, such as:  A fasting blood glucose (FBG) test. You will not be allowed to eat (you will fast) for at least 8 hours before a blood sample is taken.  An A1c (hemoglobin A1c) blood test. This provides information about blood glucose control over the previous 2-3 months.  An oral glucose tolerance test (OGTT). This measures your blood glucose at two times: ? After fasting. This is your baseline blood glucose level. ? Two hours after drinking a beverage that contains glucose.  How is   this treated? Treatment depends on the cause of your hyperglycemia. Treatment may include:  Taking medicine to regulate your blood glucose levels. If you  take insulin or other diabetes medicines, your medicine or dosage may be adjusted.  Lifestyle changes, such as exercising more, eating healthier foods, or losing weight.  Treating an illness or infection, if this caused your hyperglycemia.  Checking your blood glucose more often.  Stopping or reducing steroid medicines, if these caused your hyperglycemia.  If your hyperglycemia becomes severe and it results in hyperglycemic hyperosmolar state, you must be hospitalized and given IV fluids. Follow these instructions at home: General instructions  Take over-the-counter and prescription medicines only as told by your health care provider.  Do not use any products that contain nicotine or tobacco, such as cigarettes and e-cigarettes. If you need help quitting, ask your health care provider.  Limit alcohol intake to no more than 1 drink per day for nonpregnant women and 2 drinks per day for men. One drink equals 12 oz of beer, 5 oz of wine, or 1 oz of hard liquor.  Learn to manage stress. If you need help with this, ask your health care provider.  Keep all follow-up visits as told by your health care provider. This is important. Eating and drinking  Maintain a healthy weight.  Exercise regularly, as directed by your health care provider.  Stay hydrated, especially when you exercise, get sick, or spend time in hot temperatures.  Eat healthy foods, such as: ? Lean proteins. ? Complex carbohydrates. ? Fresh fruits and vegetables. ? Low-fat dairy products. ? Healthy fats.  Drink enough fluid to keep your urine clear or pale yellow. If you have diabetes:   Make sure you know the symptoms of hyperglycemia.  Follow your diabetes management plan, as told by your health care provider. Make sure you: ? Take your insulin and medicines as directed. ? Follow your exercise plan. ? Follow your meal plan. Eat on time, and do not skip meals. ? Check your blood glucose as often as directed.  Make sure to check your blood glucose before and after exercise. If you exercise longer or in a different way than usual, check your blood glucose more often. ? Follow your sick day plan whenever you cannot eat or drink normally. Make this plan in advance with your health care provider.  Share your diabetes management plan with people in your workplace, school, and household.  Check your urine for ketones when you are ill and as told by your health care provider.  Carry a medical alert card or wear medical alert jewelry. Contact a health care provider if:  Your blood glucose is at or above 240 mg/dL (13.3 mmol/L) for 2 days in a row.  You have problems keeping your blood glucose in your target range.  You have frequent episodes of hyperglycemia. Get help right away if:  You have difficulty breathing.  You have a change in how you think, feel, or act (mental status).  You have nausea or vomiting that does not go away. These symptoms may represent a serious problem that is an emergency. Do not wait to see if the symptoms will go away. Get medical help right away. Call your local emergency services (911 in the [place] Do not drive yourself to the hospital. Summary  Hyperglycemia occurs when the level of sugar (glucose) in the blood is too high.  Hyperglycemia is diagnosed with a blood test to measure your blood glucose level. This blood   test is usually done while you are having symptoms. Your health care provider may also do a physical exam and review your medical history.  If you have diabetes, follow your diabetes management plan as told by your health care provider.  Contact your health care provider if you have problems keeping your blood glucose in your target range. This information is not intended to replace advice given to you by your health care provider. Make sure you discuss any questions you have with your health care provider. Document Released: 09/28/2000 Document Revised:  12/21/2015 Document Reviewed: 12/21/2015 Elsevier Interactive Patient Education  2018 [employer].  

## 2017-11-29 NOTE — Progress Notes (Signed)
Discharge instructions given to patient, who verbalized understanding of these instructions.  Prescription handed to patient.  Volunteer services taking patient by wheelchair to visitors entrance.  No further questions verbalized by patient.

## 2017-11-29 NOTE — Care Management Obs Status (Signed)
MEDICARE OBSERVATION STATUS NOTIFICATION   Patient Details  Name: Deanna Hartman MRN: [id number] Date of Birth: 01/13/1956   Medicare Observation Status Notification Given:  Yes    Eber HongGreene, Elisha Mcgruder R, RN 11/29/2017, 9:12 AM

## 2017-12-03 ENCOUNTER — Other Ambulatory Visit: Payer: Self-pay

## 2017-12-03 ENCOUNTER — Inpatient Hospital Stay: Payer: Medicare Other

## 2017-12-03 ENCOUNTER — Encounter: Payer: Self-pay | Admitting: Emergency Medicine

## 2017-12-03 ENCOUNTER — Inpatient Hospital Stay
Admission: EM | Admit: 2017-12-03 | Discharge: 2017-12-06 | DRG: 637 | Disposition: A | Discharge status: Home / Self Care | Payer: Medicare Other | Attending: Internal Medicine | Admitting: Internal Medicine

## 2017-12-03 DIAGNOSIS — G9341 Metabolic encephalopathy: Secondary | ICD-10-CM | POA: Diagnosis not present

## 2017-12-03 DIAGNOSIS — E11649 Type 2 diabetes mellitus with hypoglycemia without coma: Secondary | ICD-10-CM | POA: Diagnosis present

## 2017-12-03 DIAGNOSIS — E039 Hypothyroidism, unspecified: Secondary | ICD-10-CM | POA: Diagnosis present

## 2017-12-03 DIAGNOSIS — Z9114 Patient's other noncompliance with medication regimen: Secondary | ICD-10-CM | POA: Diagnosis not present

## 2017-12-03 DIAGNOSIS — Z96643 Presence of artificial hip joint, bilateral: Secondary | ICD-10-CM | POA: Diagnosis present

## 2017-12-03 DIAGNOSIS — E1142 Type 2 diabetes mellitus with diabetic polyneuropathy: Secondary | ICD-10-CM | POA: Diagnosis present

## 2017-12-03 DIAGNOSIS — Z9111 Patient's noncompliance with dietary regimen: Secondary | ICD-10-CM

## 2017-12-03 DIAGNOSIS — M1990 Unspecified osteoarthritis, unspecified site: Secondary | ICD-10-CM | POA: Diagnosis present

## 2017-12-03 DIAGNOSIS — Z794 Long term (current) use of insulin: Secondary | ICD-10-CM | POA: Diagnosis not present

## 2017-12-03 DIAGNOSIS — Z96653 Presence of artificial knee joint, bilateral: Secondary | ICD-10-CM | POA: Diagnosis present

## 2017-12-03 DIAGNOSIS — E1310 Other specified diabetes mellitus with ketoacidosis without coma: Secondary | ICD-10-CM | POA: Diagnosis present

## 2017-12-03 DIAGNOSIS — Z79899 Other long term (current) drug therapy: Secondary | ICD-10-CM | POA: Diagnosis not present

## 2017-12-03 DIAGNOSIS — I959 Hypotension, unspecified: Secondary | ICD-10-CM | POA: Diagnosis present

## 2017-12-03 DIAGNOSIS — E1110 Type 2 diabetes mellitus with ketoacidosis without coma: Secondary | ICD-10-CM | POA: Diagnosis present

## 2017-12-03 DIAGNOSIS — I10 Essential (primary) hypertension: Secondary | ICD-10-CM | POA: Diagnosis present

## 2017-12-03 DIAGNOSIS — Z79890 Hormone replacement therapy (postmenopausal): Secondary | ICD-10-CM | POA: Diagnosis not present

## 2017-12-03 LAB — URINALYSIS, COMPLETE (UACMP) WITH MICROSCOPIC
BACTERIA UA: NONE SEEN
BILIRUBIN URINE: NEGATIVE
Hgb urine dipstick: NEGATIVE
KETONES UR: 80 mg/dL — AB
LEUKOCYTES UA: NEGATIVE
NITRITE: NEGATIVE
PROTEIN: NEGATIVE mg/dL
Specific Gravity, Urine: 1.023 (ref 1.005–1.030)
pH: 6 (ref 5.0–8.0)

## 2017-12-03 LAB — CBC
HEMATOCRIT: 35.9 % (ref 35.0–47.0)
HEMOGLOBIN: 11.3 g/dL — AB (ref 12.0–16.0)
MCH: 29.4 pg (ref 26.0–34.0)
MCHC: 31.6 g/dL — ABNORMAL LOW (ref 32.0–36.0)
MCV: 93 fL (ref 80.0–100.0)
Platelets: 360 10*3/uL (ref 150–440)
RBC: 3.86 MIL/uL (ref 3.80–5.20)
RDW: 18.3 % — ABNORMAL HIGH (ref 11.5–14.5)
WBC: 8.6 10*3/uL (ref 3.6–11.0)

## 2017-12-03 LAB — BASIC METABOLIC PANEL
ANION GAP: 22 — AB (ref 5–15)
BUN: 31 mg/dL — ABNORMAL HIGH (ref 8–23)
CO2: 15 mmol/L — ABNORMAL LOW (ref 22–32)
Calcium: 9.3 mg/dL (ref 8.9–10.3)
Chloride: 94 mmol/L — ABNORMAL LOW (ref 98–111)
Creatinine, Ser: 0.95 mg/dL (ref 0.44–1.00)
Glucose, Bld: 719 mg/dL (ref 70–99)
POTASSIUM: 4.2 mmol/L (ref 3.5–5.1)
SODIUM: 131 mmol/L — AB (ref 135–145)

## 2017-12-03 LAB — TROPONIN I: Troponin I: <0.03 ng/mL (ref <0.03)

## 2017-12-03 LAB — GLUCOSE, CAPILLARY

## 2017-12-03 MED ORDER — ONDANSETRON HCL 4 MG/2ML IJ SOLN
4.0000 mg | Freq: Once | INTRAMUSCULAR | Status: AC
  Administered 2017-12-03: 4 mg via INTRAVENOUS
  Filled 2017-12-03: qty 2

## 2017-12-03 MED ORDER — SODIUM CHLORIDE 0.9 % IV BOLUS
1000.0000 mL | Freq: Once | INTRAVENOUS | Status: AC
  Administered 2017-12-04: 1000 mL via INTRAVENOUS

## 2017-12-03 MED ORDER — DEXTROSE-NACL 5-0.45 % IV SOLN: INTRAVENOUS | Status: DC

## 2017-12-03 MED ORDER — SODIUM CHLORIDE 0.9 % IV BOLUS
1000.0000 mL | Freq: Once | INTRAVENOUS | Status: AC
  Administered 2017-12-03: 1000 mL via INTRAVENOUS

## 2017-12-03 MED ORDER — SODIUM CHLORIDE 0.9 % IV SOLN: INTRAVENOUS | Status: DC

## 2017-12-03 MED ORDER — FENTANYL CITRATE (PF) 100 MCG/2ML IJ SOLN
100.0000 ug | Freq: Once | INTRAMUSCULAR | Status: AC
  Administered 2017-12-03: 100 ug via INTRAVENOUS
  Filled 2017-12-03: qty 2

## 2017-12-03 MED ORDER — SODIUM CHLORIDE 0.9 % IV SOLN
INTRAVENOUS | Status: DC
  Filled 2017-12-03: qty 1

## 2017-12-03 MED ORDER — POTASSIUM CHLORIDE 10 MEQ/100ML IV SOLN
10.0000 meq | INTRAVENOUS | Status: DC
  Filled 2017-12-03 (×2): qty 100

## 2017-12-03 MED ORDER — DEXTROSE-NACL 5-0.45 % IV SOLN
INTRAVENOUS | Status: DC
  Administered 2017-12-04: 05:00:00 via INTRAVENOUS

## 2017-12-03 MED ORDER — SODIUM CHLORIDE 0.9 % IV SOLN
INTRAVENOUS | Status: AC
  Administered 2017-12-04: 5.1 [IU]/h via INTRAVENOUS
  Filled 2017-12-03 (×4): qty 1

## 2017-12-03 MED ORDER — SODIUM CHLORIDE 0.9 % IV SOLN
INTRAVENOUS | Status: DC
  Administered 2017-12-04: 04:00:00 via INTRAVENOUS

## 2017-12-03 MED ORDER — FENTANYL CITRATE (PF) 100 MCG/2ML IJ SOLN
50.0000 ug | Freq: Once | INTRAMUSCULAR | Status: AC
  Administered 2017-12-03: 50 ug via INTRAVENOUS
  Filled 2017-12-03: qty 2

## 2017-12-03 NOTE — ED Notes (Signed)
Pt has been ringing the call bell every 15 minutes demanding pain medications. Now claiming to be having a heart attack and that we are ignoring her. Pt will be receiving an insulin drip when it arrives to lower her bs of 719. Dr ordered fentanyl in the mean time.

## 2017-12-03 NOTE — ED Provider Notes (Addendum)
[facility] Emergency Department Provider Note ____________________________________________   First MD Initiated Contact with Patient 12/03/17 2122     (approximate)  I have reviewed the triage vital signs and the nursing notes.   HISTORY  Chief Complaint Hyperglycemia   HPI Deanna Hartman is a 62 y.o. female history of diabetes as well as hypothyroidism who was presented to the emergency department today with elevated blood glucose.  She says that her glucose has been increasing throughout the day.  She says that she ate a patty melt, blueberries and a scoop of yogurt today.  Says that she has taken a total of 120 units of Novolin throughout the day without relief of her high blood glucose which she has been monitoring at home with a glucometer.  She says that she started to feel pounding in the left side of her chest which is her signal that her glucose is high and that she could be in possible DKA.  This is when she reported to the hospital.  Says that she is nauseous but without any vomiting.   Past Medical History:  Diagnosis Date  . Arthritis   . Diabetes mellitus without complication (HCC)   . Hypothyroidism     Patient Active Problem List   Diagnosis Date Noted  . Hyperglycemia 11/29/2017  . Hyperosmolar non-ketotic state in patient with type 2 diabetes mellitus (HCC) 11/28/2017    Past Surgical History:  Procedure Laterality Date  . REPLACEMENT TOTAL KNEE BILATERAL    . TOTAL HIP ARTHROPLASTY Bilateral     Prior to Admission medications   Medication Sig Start Date End Date Taking? Authorizing Provider  ALPRAZolam (XANAX) 0.25 MG tablet Take 0.25 mg by mouth at bedtime.     [provider]  furosemide (LASIX) 40 MG tablet Take 1 tablet (40 mg total) by mouth daily as needed. 11/29/17   Enid BaasKalisetti, Radhika, MD  GABAPENTIN PO Take 1-2 tablets by mouth 2 (two) times daily. Take 1 tablet by mouth in the morning and 2 at bedtime.     [provider]  insulin aspart (NOVOLOG FLEXPEN) 100 UNIT/ML FlexPen Inject 3-20 Units into the skin 3 (three) times daily with meals. Sliding scale    [provider]  Insulin Glargine (BASAGLAR KWIKPEN) 100 UNIT/ML SOPN Inject 0.33 mLs (33 Units total) into the skin at bedtime. 11/29/17   Enid BaasKalisetti, Radhika, MD  levothyroxine (SYNTHROID, LEVOTHROID) 150 MCG tablet Take 150 mcg by mouth daily.     [provider]  pioglitazone (ACTOS) 30 MG tablet Take 30 mg by mouth daily.     [provider]  pramipexole (MIRAPEX) 0.75 MG tablet Take 1-2 tablets by mouth 2 (two) times daily. Take 1 tablet by mouth in the morning and 2 at bedtime.     [provider]  traMADol (ULTRAM) 50 MG tablet Take 1 tablet (50 mg total) by mouth every 6 (six) hours as needed for up to 5 days. 11/29/17 12/04/17  Enid BaasKalisetti, Radhika, MD    Allergies Patient has no known allergies.  Family History  Adopted: Yes    Social History Social History   Tobacco Use  . Smoking status: Never Smoker  . Smokeless tobacco: Never Used  Substance Use Topics  . Alcohol use: Not Currently    Frequency: Never  . Drug use: Never    Review of Systems  Constitutional: No fever/chills Eyes: No visual changes. ENT: No sore throat. Cardiovascular: As above Respiratory: Denies shortness of breath. Gastrointestinal: No  abdominal pain. no vomiting.  No diarrhea.  No constipation. Genitourinary: Negative for dysuria. Musculoskeletal: Negative for back pain. Skin: Negative for rash. Neurological: Negative for headaches, focal weakness or numbness.   ____________________________________________   PHYSICAL EXAM:  VITAL SIGNS: ED Triage Vitals  Enc Vitals Group     BP 12/03/17 2105 (!) 165/77     Pulse Rate 12/03/17 2105 (!) 102     Resp 12/03/17 2105 20     Temp 12/03/17 2105 98.4 F (36.9 C)     Temp Source 12/03/17 2105 Oral     SpO2 12/03/17 2105 99 %     Weight 12/03/17  2106 166 lb 7.2 oz (75.5 kg)     Height 12/03/17 2106 5\' 3"  (1.6 m)     Head Circumference --      Peak Flow --      Pain Score 12/03/17 2106 10     Pain Loc --      Pain Edu? --      Excl. in GC? --     Constitutional: Alert and oriented.  Appears uncomfortable.  Holding emesis bag which is empty. Eyes: Conjunctivae are normal.  Head: Atraumatic. Nose: No congestion/rhinnorhea. Mouth/Throat: Mucous membranes are dry Neck: No stridor.   Cardiovascular: Cardia, regular rhythm. Grossly normal heart sounds.   Respiratory: Normal respiratory effort.  No retractions. Lungs CTAB. Gastrointestinal: Soft and nontender. No distention.  Musculoskeletal: No lower extremity tenderness nor edema.  No joint effusions. Neurologic:  Normal speech and language. No gross focal neurologic deficits are appreciated. Skin:  Skin is warm, dry and intact. No rash noted. Psychiatric: Mood and affect are normal. Speech and behavior are normal.  ____________________________________________   LABS (all labs ordered are listed, but only abnormal results are displayed)  Labs Reviewed  BASIC METABOLIC PANEL - Abnormal; Notable for the following components:      Result Value   Sodium 131 (*)    Chloride 94 (*)    CO2 15 (*)    Glucose, Bld 719 (*)    BUN 31 (*)    Anion gap 22 (*)    All other components within normal limits  CBC - Abnormal; Notable for the following components:   Hemoglobin 11.3 (*)    MCHC 31.6 (*)    RDW 18.3 (*)    All other components within normal limits  URINALYSIS, COMPLETE (UACMP) WITH MICROSCOPIC - Abnormal; Notable for the following components:   Color, Urine COLORLESS (*)    APPearance CLEAR (*)    Glucose, UA >=500 (*)    Ketones, ur 80 (*)    All other components within normal limits  TROPONIN I  CBG MONITORING, ED   ____________________________________________  EKG  ED ECG REPORT I, Arelia Longestavid M Debbie Bellucci, the attending physician, personally viewed and interpreted  this ECG.   Date: 12/03/2017  EKG Time: 2109  Rate: 104  Rhythm: sinus tachycardia  Axis: Normal  Intervals:none  ST&T Change: No ST segment elevation or depression.  No abnormal T wave inversion.  ____________________________________________  RADIOLOGY   ____________________________________________   PROCEDURES  Procedure(s) performed:   .Critical Care Performed by: Myrna BlazerSchaevitz, Syncere Eble Matthew, MD Authorized by: Myrna BlazerSchaevitz, Russ Looper Matthew, MD   Critical care provider statement:    Critical care time (minutes):  35   Critical care time was exclusive of:  Separately billable procedures and treating other patients   Critical care was necessary to treat or prevent imminent or life-threatening deterioration of the following conditions:  Metabolic crisis  Critical care was time spent personally by me on the following activities:  Development of treatment plan with patient or surrogate, discussions with consultants, evaluation of patient's response to treatment, examination of patient, obtaining history from patient or surrogate, ordering and performing treatments and interventions, ordering and review of laboratory studies, ordering and review of radiographic studies, pulse oximetry, re-evaluation of patient's condition and review of old charts    Critical Care performed:    ____________________________________________   INITIAL IMPRESSION / ASSESSMENT AND PLAN / ED COURSE  Pertinent labs & imaging results that were available during my care of the patient were reviewed by me and considered in my medical decision making (see chart for details).  DDX: Hyperglycemia, DKA, renal failure, dehydration, less light abnormality As part of my medical decision making, I reviewed the following data within the electronic MEDICAL RECORD NUMBER Notes from prior ED visits  ----------------------------------------- 10:28 PM on 12/03/2017 -----------------------------------------  Patient to be  admitted to the hospital.  To start on insulin drip.  Patient aware of diagnosis.  Appears to be in DKA.  Admitted to Dr. Caryn Bee of the medicine service. ____________________________________________   FINAL CLINICAL IMPRESSION(S) / ED DIAGNOSES  DKA.  NEW MEDICATIONS STARTED DURING THIS VISIT:  New Prescriptions   No medications on file     Note:  This document was prepared using Dragon voice recognition software and may include unintentional dictation errors.     Myrna Blazer, MD 12/03/17 2228  Patient complaining of persistent chest pain despite administration of fentanyl.  We will give another dose.  Patient with reassuring EKG without ischemic change.  Negative troponin.   Myrna Blazer, MD 12/03/17 [phone]

## 2017-12-03 NOTE — ED Notes (Signed)
Pt 's bs 719. Dr aware. Pt to be on glucostabilizer and admitted.

## 2017-12-03 NOTE — ED Triage Notes (Signed)
[place] has been [place] from [place] today and her sugar has been since fluctuating all day. 70 this am. Sugar was noticed to be high at breakfast. 90 units over the course of the day with sugar still in the 500's. Does not have a pcp.

## 2017-12-03 NOTE — ED Notes (Signed)
Pt states always has chest pain when her sugar is high and requesting pain meds. Advised she needs to see the dr first.

## 2017-12-04 ENCOUNTER — Inpatient Hospital Stay: Payer: Self-pay

## 2017-12-04 DIAGNOSIS — E1110 Type 2 diabetes mellitus with ketoacidosis without coma: Principal | ICD-10-CM

## 2017-12-04 LAB — URINE DRUG SCREEN, QUALITATIVE (ARMC ONLY)
Amphetamines, Ur Screen: NOT DETECTED
BARBITURATES, UR SCREEN: NOT DETECTED
CANNABINOID 50 NG, UR ~~LOC~~: NOT DETECTED
COCAINE METABOLITE, UR ~~LOC~~: NOT DETECTED
MDMA (Ecstasy)Ur Screen: NOT DETECTED
METHADONE SCREEN, URINE: NOT DETECTED
OPIATE, UR SCREEN: NOT DETECTED
Phencyclidine (PCP) Ur S: NOT DETECTED
TRICYCLIC, UR SCREEN: NOT DETECTED

## 2017-12-04 LAB — GLUCOSE, CAPILLARY
GLUCOSE-CAPILLARY: 187 mg/dL — AB (ref 70–99)
GLUCOSE-CAPILLARY: 260 mg/dL — AB (ref 70–99)
GLUCOSE-CAPILLARY: 347 mg/dL — AB (ref 70–99)
GLUCOSE-CAPILLARY: 250 mg/dL — AB (ref 70–99)
GLUCOSE-CAPILLARY: 89 mg/dL (ref 70–99)
GLUCOSE-CAPILLARY: 157 mg/dL — AB (ref 70–99)
GLUCOSE-CAPILLARY: 221 mg/dL — AB (ref 70–99)
GLUCOSE-CAPILLARY: 228 mg/dL — AB (ref 70–99)
Glucose-Capillary: >600 mg/dL (ref 70–99)
Glucose-Capillary: 481 mg/dL — ABNORMAL HIGH (ref 70–99)
Glucose-Capillary: 300 mg/dL — ABNORMAL HIGH (ref 70–99)
Glucose-Capillary: 161 mg/dL — ABNORMAL HIGH (ref 70–99)
Glucose-Capillary: 78 mg/dL (ref 70–99)
Glucose-Capillary: 60 mg/dL — ABNORMAL LOW (ref 70–99)
Glucose-Capillary: 369 mg/dL — ABNORMAL HIGH (ref 70–99)
Glucose-Capillary: 381 mg/dL — ABNORMAL HIGH (ref 70–99)
Glucose-Capillary: 138 mg/dL — ABNORMAL HIGH (ref 70–99)
Glucose-Capillary: 314 mg/dL — ABNORMAL HIGH (ref 70–99)

## 2017-12-04 LAB — BASIC METABOLIC PANEL
ANION GAP: 6 (ref 5–15)
ANION GAP: 4 — AB (ref 5–15)
Anion gap: 25 — ABNORMAL HIGH (ref 5–15)
Anion gap: 10 (ref 5–15)
Anion gap: 10 (ref 5–15)
Anion gap: 6 (ref 5–15)
BUN: 33 mg/dL — ABNORMAL HIGH (ref 8–23)
BUN: 29 mg/dL — ABNORMAL HIGH (ref 8–23)
BUN: 27 mg/dL — ABNORMAL HIGH (ref 8–23)
BUN: 24 mg/dL — ABNORMAL HIGH (ref 8–23)
BUN: 20 mg/dL (ref 8–23)
BUN: 19 mg/dL (ref 8–23)
CHLORIDE: 114 mmol/L — AB (ref 98–111)
CHLORIDE: 113 mmol/L — AB (ref 98–111)
CHLORIDE: 115 mmol/L — AB (ref 98–111)
CO2: 10 mmol/L — ABNORMAL LOW (ref 22–32)
CO2: 17 mmol/L — ABNORMAL LOW (ref 22–32)
CO2: 15 mmol/L — ABNORMAL LOW (ref 22–32)
CO2: 17 mmol/L — ABNORMAL LOW (ref 22–32)
CO2: 14 mmol/L — ABNORMAL LOW (ref 22–32)
CO2: 20 mmol/L — ABNORMAL LOW (ref 22–32)
CREATININE: 0.88 mg/dL (ref 0.44–1.00)
Calcium: 9.1 mg/dL (ref 8.9–10.3)
Calcium: 8.7 mg/dL — ABNORMAL LOW (ref 8.9–10.3)
Calcium: 8.2 mg/dL — ABNORMAL LOW (ref 8.9–10.3)
Calcium: 8.3 mg/dL — ABNORMAL LOW (ref 8.9–10.3)
Calcium: 8.2 mg/dL — ABNORMAL LOW (ref 8.9–10.3)
Calcium: 8.3 mg/dL — ABNORMAL LOW (ref 8.9–10.3)
Chloride: 100 mmol/L (ref 98–111)
Chloride: 118 mmol/L — ABNORMAL HIGH (ref 98–111)
Chloride: 114 mmol/L — ABNORMAL HIGH (ref 98–111)
Creatinine, Ser: 1.2 mg/dL — ABNORMAL HIGH (ref 0.44–1.00)
Creatinine, Ser: 0.71 mg/dL (ref 0.44–1.00)
Creatinine, Ser: 0.87 mg/dL (ref 0.44–1.00)
Creatinine, Ser: 0.64 mg/dL (ref 0.44–1.00)
Creatinine, Ser: 0.78 mg/dL (ref 0.44–1.00)
GFR calc Af Amer: >60 mL/min (ref >60)
GFR calc non Af Amer: 47 mL/min — ABNORMAL LOW (ref >60)
GFR calc non Af Amer: >60 mL/min (ref >60)
GFR calc non Af Amer: >60 mL/min (ref >60)
GFR calc non Af Amer: >60 mL/min (ref >60)
GFR, EST AFRICAN AMERICAN: 54 mL/min — AB (ref >60)
Glucose, Bld: 740 mg/dL (ref 70–99)
Glucose, Bld: 199 mg/dL — ABNORMAL HIGH (ref 70–99)
Glucose, Bld: 284 mg/dL — ABNORMAL HIGH (ref 70–99)
Glucose, Bld: 344 mg/dL — ABNORMAL HIGH (ref 70–99)
Glucose, Bld: 146 mg/dL — ABNORMAL HIGH (ref 70–99)
Glucose, Bld: 271 mg/dL — ABNORMAL HIGH (ref 70–99)
POTASSIUM: 4.5 mmol/L (ref 3.5–5.1)
POTASSIUM: 3.5 mmol/L (ref 3.5–5.1)
POTASSIUM: 4.1 mmol/L (ref 3.5–5.1)
POTASSIUM: 4 mmol/L (ref 3.5–5.1)
POTASSIUM: 4.8 mmol/L (ref 3.5–5.1)
POTASSIUM: 4.8 mmol/L (ref 3.5–5.1)
SODIUM: 135 mmol/L (ref 135–145)
SODIUM: 138 mmol/L (ref 135–145)
SODIUM: 138 mmol/L (ref 135–145)
SODIUM: 138 mmol/L (ref 135–145)
Sodium: 141 mmol/L (ref 135–145)
Sodium: 138 mmol/L (ref 135–145)

## 2017-12-04 LAB — BLOOD GAS, ARTERIAL
Acid-base deficit: 9.5 mmol/L — ABNORMAL HIGH (ref 0.0–2.0)
Bicarbonate: 14.9 mmol/L — ABNORMAL LOW (ref 20.0–28.0)
FIO2: 0.32
O2 Saturation: 98.4 %
Patient temperature: 37
pCO2 arterial: 27 mmHg — ABNORMAL LOW (ref 32.0–48.0)
pH, Arterial: 7.35 (ref 7.350–7.450)
pO2, Arterial: 118 mmHg — ABNORMAL HIGH (ref 83.0–108.0)

## 2017-12-04 LAB — CBC
HEMATOCRIT: 29.1 % — AB (ref 35.0–47.0)
HEMOGLOBIN: 9.7 g/dL — AB (ref 12.0–16.0)
MCH: 29.2 pg (ref 26.0–34.0)
MCHC: 33.3 g/dL (ref 32.0–36.0)
MCV: 87.7 fL (ref 80.0–100.0)
Platelets: 319 10*3/uL (ref 150–440)
RBC: 3.31 MIL/uL — AB (ref 3.80–5.20)
RDW: 16.9 % — ABNORMAL HIGH (ref 11.5–14.5)
WBC: 13.1 10*3/uL — ABNORMAL HIGH (ref 3.6–11.0)

## 2017-12-04 LAB — BETA-HYDROXYBUTYRIC ACID: Beta-Hydroxybutyric Acid: 0.26 mmol/L (ref 0.05–0.27)

## 2017-12-04 LAB — MRSA PCR SCREENING: MRSA BY PCR: NEGATIVE

## 2017-12-04 MED ORDER — DEXTROSE 50 % IV SOLN
1.0000 | Freq: Once | INTRAVENOUS | Status: AC
  Administered 2017-12-04: 50 mL via INTRAVENOUS

## 2017-12-04 MED ORDER — BISACODYL 5 MG PO TBEC: 5.0000 mg | DELAYED_RELEASE_TABLET | Freq: Every day | ORAL | Status: DC | PRN

## 2017-12-04 MED ORDER — INSULIN ASPART 100 UNIT/ML ~~LOC~~ SOLN
0.0000 [IU] | Freq: Three times a day (TID) | SUBCUTANEOUS | Status: DC
  Administered 2017-12-04: 3 [IU] via SUBCUTANEOUS
  Administered 2017-12-05: 5 [IU] via SUBCUTANEOUS
  Administered 2017-12-05: 3 [IU] via SUBCUTANEOUS
  Administered 2017-12-05: 5 [IU] via SUBCUTANEOUS
  Filled 2017-12-04 (×4): qty 1

## 2017-12-04 MED ORDER — DEXTROSE-NACL 5-0.45 % IV SOLN
INTRAVENOUS | Status: DC
  Administered 2017-12-04: 100 mL/h via INTRAVENOUS

## 2017-12-04 MED ORDER — ACETAMINOPHEN 325 MG PO TABS
650.0000 mg | ORAL_TABLET | Freq: Four times a day (QID) | ORAL | Status: DC | PRN
  Administered 2017-12-05: 650 mg via ORAL
  Filled 2017-12-04: qty 2

## 2017-12-04 MED ORDER — INSULIN ASPART 100 UNIT/ML ~~LOC~~ SOLN
0.0000 [IU] | Freq: Every day | SUBCUTANEOUS | Status: DC
  Administered 2017-12-04: 4 [IU] via SUBCUTANEOUS
  Filled 2017-12-04: qty 1

## 2017-12-04 MED ORDER — TRAZODONE HCL 50 MG PO TABS
25.0000 mg | ORAL_TABLET | Freq: Every evening | ORAL | Status: DC | PRN
  Administered 2017-12-04: 25 mg via ORAL
  Filled 2017-12-04 (×2): qty 1

## 2017-12-04 MED ORDER — DEXMEDETOMIDINE HCL IN NACL 400 MCG/100ML IV SOLN
0.0000 ug/kg/h | INTRAVENOUS | Status: DC
  Administered 2017-12-04: 0.5 ug/kg/h via INTRAVENOUS
  Filled 2017-12-04: qty 100

## 2017-12-04 MED ORDER — DIPHENHYDRAMINE HCL 50 MG/ML IJ SOLN
50.0000 mg | Freq: Once | INTRAMUSCULAR | Status: AC
  Administered 2017-12-04: 50 mg via INTRAVENOUS
  Filled 2017-12-04: qty 1

## 2017-12-04 MED ORDER — HALOPERIDOL LACTATE 5 MG/ML IJ SOLN
5.0000 mg | Freq: Once | INTRAMUSCULAR | Status: AC
  Administered 2017-12-04: 5 mg via INTRAVENOUS
  Filled 2017-12-04: qty 1

## 2017-12-04 MED ORDER — NOREPINEPHRINE 4 MG/250ML-% IV SOLN
0.0000 ug/min | INTRAVENOUS | Status: DC
  Administered 2017-12-04: 5 ug/min via INTRAVENOUS

## 2017-12-04 MED ORDER — PHENYLEPHRINE HCL-NACL 10-0.9 MG/250ML-% IV SOLN
0.0000 ug/min | INTRAVENOUS | Status: DC
  Administered 2017-12-04: 110 ug/min via INTRAVENOUS
  Administered 2017-12-04: 50 ug/min via INTRAVENOUS
  Administered 2017-12-04: 80 ug/min via INTRAVENOUS
  Administered 2017-12-04: 20 ug/min via INTRAVENOUS
  Filled 2017-12-04: qty 10
  Filled 2017-12-04 (×5): qty 250
  Filled 2017-12-04: qty 10

## 2017-12-04 MED ORDER — HYDRALAZINE HCL 20 MG/ML IJ SOLN: 10.0000 mg | Freq: Four times a day (QID) | INTRAMUSCULAR | Status: DC | PRN

## 2017-12-04 MED ORDER — ONDANSETRON HCL 4 MG PO TABS: 4.0000 mg | ORAL_TABLET | Freq: Four times a day (QID) | ORAL | Status: DC | PRN

## 2017-12-04 MED ORDER — NOREPINEPHRINE 4 MG/250ML-% IV SOLN: 0.0000 ug/min | INTRAVENOUS | Status: DC

## 2017-12-04 MED ORDER — ONDANSETRON HCL 4 MG/2ML IJ SOLN: 4.0000 mg | Freq: Four times a day (QID) | INTRAMUSCULAR | Status: DC | PRN

## 2017-12-04 MED ORDER — ACETAMINOPHEN 650 MG RE SUPP: 650.0000 mg | Freq: Four times a day (QID) | RECTAL | Status: DC | PRN

## 2017-12-04 MED ORDER — SODIUM CHLORIDE 0.9 % IV BOLUS
1000.0000 mL | Freq: Once | INTRAVENOUS | Status: AC
  Administered 2017-12-04: 1000 mL via INTRAVENOUS

## 2017-12-04 MED ORDER — POTASSIUM CHLORIDE 10 MEQ/100ML IV SOLN
10.0000 meq | INTRAVENOUS | Status: AC
  Administered 2017-12-04 (×2): 10 meq via INTRAVENOUS
  Filled 2017-12-04 (×2): qty 100

## 2017-12-04 MED ORDER — DEXTROSE 50 % IV SOLN
INTRAVENOUS | Status: AC
  Administered 2017-12-04: 50 mL via INTRAVENOUS
  Filled 2017-12-04: qty 50

## 2017-12-04 MED ORDER — DOCUSATE SODIUM 100 MG PO CAPS
100.0000 mg | ORAL_CAPSULE | Freq: Two times a day (BID) | ORAL | Status: DC
  Administered 2017-12-05 – 2017-12-06 (×2): 100 mg via ORAL
  Filled 2017-12-04 (×2): qty 1

## 2017-12-04 MED ORDER — HEPARIN SODIUM (PORCINE) 5000 UNIT/ML IJ SOLN
5000.0000 [IU] | Freq: Three times a day (TID) | INTRAMUSCULAR | Status: DC
  Administered 2017-12-04 – 2017-12-05 (×4): 5000 [IU] via SUBCUTANEOUS
  Filled 2017-12-04 (×4): qty 1

## 2017-12-04 MED ORDER — HYDROCODONE-ACETAMINOPHEN 5-325 MG PO TABS
1.0000 | ORAL_TABLET | ORAL | Status: DC | PRN
  Administered 2017-12-04 – 2017-12-06 (×9): 1 via ORAL
  Administered 2017-12-06: 2 via ORAL
  Administered 2017-12-06: 1 via ORAL
  Filled 2017-12-04 (×10): qty 1
  Filled 2017-12-04: qty 2

## 2017-12-04 MED ORDER — SODIUM CHLORIDE 0.9 % IV SOLN
INTRAVENOUS | Status: DC
  Administered 2017-12-04 – 2017-12-06 (×4): via INTRAVENOUS

## 2017-12-04 MED ORDER — PRAMIPEXOLE DIHYDROCHLORIDE 0.25 MG PO TABS
0.7500 mg | ORAL_TABLET | Freq: Two times a day (BID) | ORAL | Status: DC
  Administered 2017-12-04 – 2017-12-06 (×5): 0.75 mg via ORAL
  Filled 2017-12-04 (×5): qty 3

## 2017-12-04 MED ORDER — INSULIN GLARGINE 100 UNIT/ML ~~LOC~~ SOLN
33.0000 [IU] | Freq: Every day | SUBCUTANEOUS | Status: DC
  Administered 2017-12-04: 33 [IU] via SUBCUTANEOUS
  Filled 2017-12-04 (×2): qty 0.33

## 2017-12-04 NOTE — Progress Notes (Signed)
[facility] [place] [facility]   PATIENT NAME: Deanna Hartman    MR#:  [id number]  DATE OF BIRTH:  09/29/1955  SUBJECTIVE:  States she is feeling really thirsty this morning. No nausea or vomiting. No chest pain.   REVIEW OF SYSTEMS:  Review of Systems  Constitutional: Negative for chills and fever.  HENT: Negative for congestion and sore throat.   Eyes: Negative for blurred vision and double vision.  Respiratory: Negative for cough and shortness of breath.   Cardiovascular: Negative for chest pain and palpitations.  Gastrointestinal: Negative for nausea and vomiting.  Genitourinary: Negative for dysuria and hematuria.  Musculoskeletal: Negative for back pain and myalgias.  Neurological: Negative for dizziness and headaches.    DRUG ALLERGIES:  No Known Allergies VITALS:  Blood pressure 128/68, pulse 79, temperature 98.4 F (36.9 C), temperature source Oral, resp. rate (!) 23, height 5\' 3"  (1.6 m), weight 73 kg, SpO2 100 %. PHYSICAL EXAMINATION:  Physical Exam  GENERAL:  62 y.o.-year-old patient lying in the bed with no acute distress.  EYES: Pupils equal, round, reactive to light and accommodation. No scleral icterus. Extraocular muscles intact.  HEENT: Head atraumatic, normocephalic. Oropharynx and nasopharynx clear. Dry mucous membranes. NECK:  Supple, no jugular venous distention. No thyroid enlargement, no tenderness.  LUNGS: CTAB, no wheezing, rales,rhonchi or crepitation. No use of accessory muscles of respiration.  CARDIOVASCULAR: S1, S2 normal. No S3/S4.  ABDOMEN: Soft, nontender, nondistended. Bowel sounds present. No organomegaly or mass.  EXTREMITIES: No pedal edema, cyanosis, or clubbing.  NEUROLOGIC: No focal weakness. PSYCHIATRIC: The patient is alert and oriented x 3.  SKIN: No obvious rash, lesion, or ulcer.   LABORATORY PANEL:  Female CBC Recent Labs  Lab 12/04/17 0448  WBC 13.1*  HGB 9.7*  HCT 29.1*  PLT 319    ------------------------------------------------------------------------------------------------------------------ Chemistries  Recent Labs  Lab 11/28/17 0628  12/04/17 1723  NA 128*   < > 138  K 4.7   < > 4.8  CL 95*   < > 118*  CO2 21*   < > 14*  GLUCOSE 749*   < > 146*  BUN 22   < > 20  CREATININE 0.81   < > 0.64  CALCIUM 9.0   < > 8.2*  AST 21  --   --   ALT 21  --   --   ALKPHOS 212*  --   --   BILITOT 1.4*  --   --    < > = values in this interval not displayed.   RADIOLOGY:  Dg Chest 1 View  Result Date: 12/03/2017 CLINICAL DATA:  Fluctuating blood sugar, chest pain EXAM: CHEST  1 VIEW COMPARISON:  11/28/2017 FINDINGS: Subsegmental atelectasis left base. No focal consolidation or effusion. Borderline to mild cardiomegaly. Aortic atherosclerosis. No pneumothorax. IMPRESSION: No active disease. Subsegmental atelectasis at the left base. Borderline cardiomegaly. Electronically Signed   By: Jasmine PangKim  Fujinaga M.D.   On: 12/03/2017 23:21   [place] Ekg Site Rite  Result Date: 12/04/2017 If Site Rite image not attached, placement could not be confirmed due to current cardiac rhythm.  ASSESSMENT AND PLAN:   DKA- acute, related to medication non-compliance - transitioned from insulin gtt to subcutaneous insulin today  Hypotension- noted this morning - s/p fluid boluses - now on levophed per CCM  Hypothyroidism- stable - continue home synthroid  Peripheral neuropathy- stable - continue home gabapentin  All the records are reviewed and case discussed with Care Management/Social  Worker. Management plans discussed with the patient, family and they are in agreement.  CODE STATUS: Full Code  TOTAL TIME TAKING CARE OF THIS PATIENT: 35 minutes.   More than 50% of the time was spent in counseling/coordination of care: YES  POSSIBLE D/C IN 2-3 DAYS, DEPENDING ON CLINICAL CONDITION.   Jinny BlossomKaty D Mayo M.D on 12/04/2017 at 8:34 PM  Between 7am to 6pm - Pager - [phone]  After  6pm go to [email or web address] - Social research officer, governmentpassword [email or web address]  [facility] [place] [facility]  Office  [phone]  CC: Primary care physician; Patient, No Pcp Per  Note: This dictation was prepared with Dragon dictation along with smaller phrase technology. Any transcriptional errors that result from this process are unintentional.

## 2017-12-04 NOTE — ED Notes (Signed)
Attempt x 2 by this RN for IV unsuccessful. IV team consult placed.

## 2017-12-04 NOTE — H&P (Signed)
[facility] - [place] [facility]   PATIENT NAME: Deanna Hartman    MR#:  [id number]  DATE OF BIRTH:  08/26/1955  DATE OF ADMISSION:  12/03/2017  PRIMARY CARE PHYSICIAN: Patient, No Pcp Per   REQUESTING/REFERRING PHYSICIAN:   CHIEF COMPLAINT:   Chief Complaint  Patient presents with  . Hyperglycemia    HISTORY OF PRESENT ILLNESS: Deanna Hartman  is a 62 y.o. female with a known history of hypertension, uncontrolled type 2 diabetes and hypothyroidism. Patient presented to emergency room for elevated blood sugar and nausea going on for the past 24 hours. She states she is compliant with her insulin treatment, but not sure if she follows proper diet mostly at times. She denies any fever or chills, no cough, no chest pain, no vomiting, no abdominal pain, no bleeding. Blood test done emergency room are remarkable for glucose level at 719, anion gap at 22, sodium level 131, creatinine level at 0.9.  Hgb is 11.3.  UA is positive for ketones but no UTI. Chest x-ray is negative for any acute cardiopulmonary abnormality. Patient is admitted for further evaluation and treatment.   PAST MEDICAL HISTORY:   Past Medical History:  Diagnosis Date  . Arthritis   . Diabetes mellitus without complication (HCC)   . Hypothyroidism     PAST SURGICAL HISTORY:  Past Surgical History:  Procedure Laterality Date  . REPLACEMENT TOTAL KNEE BILATERAL    . TOTAL HIP ARTHROPLASTY Bilateral     SOCIAL HISTORY:  Social History   Tobacco Use  . Smoking status: Never Smoker  . Smokeless tobacco: Never Used  Substance Use Topics  . Alcohol use: Not Currently    Frequency: Never    FAMILY HISTORY:  Family History  Adopted: Yes    DRUG ALLERGIES: No Known Allergies  REVIEW OF SYSTEMS:   CONSTITUTIONAL: No fever, positive for fatigue generalized weakness.  EYES: No changes in vision.  EARS, NOSE, AND THROAT: No tinnitus or ear pain.  RESPIRATORY: No cough, shortness  of breath, wheezing or hemoptysis.  CARDIOVASCULAR: No chest pain, orthopnea, edema.  GASTROINTESTINAL: No nausea, vomiting, diarrhea or abdominal pain.  GENITOURINARY: No dysuria, hematuria.  ENDOCRINE: No polyuria, nocturia. HEMATOLOGY: No bleeding. SKIN: No rash or lesion. MUSCULOSKELETAL: No joint pain at this time.   NEUROLOGIC: No focal weakness.  PSYCHIATRY: Positive history of anxiety disorder.   MEDICATIONS AT HOME:  Prior to Admission medications   Medication Sig Start Date End Date Taking? Authorizing Provider  ALPRAZolam (XANAX) 0.25 MG tablet Take 0.25 mg by mouth at bedtime.    Yes [provider]  furosemide (LASIX) 40 MG tablet Take 1 tablet (40 mg total) by mouth daily as needed. 11/29/17  Yes Enid BaasKalisetti, Radhika, MD  GABAPENTIN PO Take 1-2 tablets by mouth 2 (two) times daily. Take 1 tablet by mouth in the morning and 2 at bedtime.   Yes [provider]  insulin aspart (NOVOLOG FLEXPEN) 100 UNIT/ML FlexPen Inject 3-20 Units into the skin 3 (three) times daily with meals. Sliding scale   Yes [provider]  Insulin Glargine (BASAGLAR KWIKPEN) 100 UNIT/ML SOPN Inject 0.33 mLs (33 Units total) into the skin at bedtime. 11/29/17  Yes Enid BaasKalisetti, Radhika, MD  levothyroxine (SYNTHROID, LEVOTHROID) 175 MCG tablet Take 175 mcg by mouth daily.    Yes [provider]  pioglitazone (ACTOS) 30 MG tablet Take 30 mg by mouth daily.    Yes [provider]  pramipexole (MIRAPEX) 0.75 MG tablet Take  1-2 tablets by mouth 2 (two) times daily. Take 1 tablet by mouth in the morning and 2 at bedtime.    Yes [provider]  traMADol (ULTRAM) 50 MG tablet Take 1 tablet (50 mg total) by mouth every 6 (six) hours as needed for up to 5 days. 11/29/17 12/04/17 Yes Enid Baas, MD      PHYSICAL EXAMINATION:   VITAL SIGNS: Blood pressure (!) 170/72, pulse (!) 138, temperature 98.7 F (37.1 C), temperature source Oral, resp. rate (!) 29, height  5\' 3"  (1.6 m), weight 73 kg, SpO2 100 %.  GENERAL:  62 y.o.-year-old patient lying in the bed with no acute distress.  EYES: Pupils equal, round, reactive to light and accommodation. No scleral icterus. Extraocular muscles intact.  HEENT: Head atraumatic, normocephalic. Oropharynx and nasopharynx clear.  NECK:  Supple, no jugular venous distention. No thyroid enlargement, no tenderness.  LUNGS: Reduced breath sounds bilaterally, no wheezing, rales,rhonchi or crepitation. No use of accessory muscles of respiration.  CARDIOVASCULAR: S1, S2 normal. No S3/S4.  ABDOMEN: Soft, nontender, nondistended. Bowel sounds present. No organomegaly or mass.  EXTREMITIES: No pedal edema, cyanosis, or clubbing.  NEUROLOGIC: No focal weakness. PSYCHIATRIC: The patient is alert and oriented x 3.  SKIN: No obvious rash, lesion, or ulcer.   LABORATORY PANEL:   CBC Recent Labs  Lab 11/28/17 0628 11/29/17 0544 12/03/17 2117  WBC 7.3 4.4 8.6  HGB 11.5* 10.5* 11.3*  HCT 35.6 31.8* 35.9  PLT 349 329 360  MCV 91.1 88.3 93.0  MCH 29.5 29.2 29.4  MCHC 32.4 33.1 31.6*  RDW 17.9* 16.8* 18.3*  LYMPHSABS 1.7  --   --   MONOABS 0.3  --   --   EOSABS 0.2  --   --   BASOSABS 0.0  --   --    ------------------------------------------------------------------------------------------------------------------  Chemistries  Recent Labs  Lab 11/28/17 0628 11/28/17 1449 11/29/17 0544 12/03/17 2117 12/04/17 0151  NA 128* 133* 137 131* 135  K 4.7 4.9 4.5 4.2 4.5  CL 95* 107 104 94* 100  CO2 21* 20* 25 15* 10*  GLUCOSE 749* 151* 171* 719* 740*  BUN 22 14 14  31* 33*  CREATININE 0.81 0.53 0.60 0.95 1.20*  CALCIUM 9.0 8.4* 8.7* 9.3 9.1  AST 21  --   --   --   --   ALT 21  --   --   --   --   ALKPHOS 212*  --   --   --   --   BILITOT 1.4*  --   --   --   --    ------------------------------------------------------------------------------------------------------------------ estimated creatinine clearance is  45.3 mL/min (A) (by C-G formula based on SCr of 1.2 mg/dL (H)). ------------------------------------------------------------------------------------------------------------------ No results for input(s): TSH, T4TOTAL, T3FREE, THYROIDAB in the last 72 hours.  Invalid input(s): FREET3   Coagulation profile No results for input(s): INR, PROTIME in the last 168 hours. ------------------------------------------------------------------------------------------------------------------- No results for input(s): DDIMER in the last 72 hours. -------------------------------------------------------------------------------------------------------------------  Cardiac Enzymes Recent Labs  Lab 11/28/17 1449 11/29/17 0544 12/03/17 2117  TROPONINI 0.03* <0.03 <0.03   ------------------------------------------------------------------------------------------------------------------ Invalid input(s): POCBNP  ---------------------------------------------------------------------------------------------------------------  Urinalysis    Component Value Date/Time   COLORURINE COLORLESS (A) 12/03/2017 2145   APPEARANCEUR CLEAR (A) 12/03/2017 2145   LABSPEC 1.023 12/03/2017 2145   PHURINE 6.0 12/03/2017 2145   GLUCOSEU >=500 (A) 12/03/2017 2145   HGBUR NEGATIVE 12/03/2017 2145   BILIRUBINUR NEGATIVE 12/03/2017 2145   KETONESUR 80 (A) 12/03/2017 2145  PROTEINUR NEGATIVE 12/03/2017 2145   NITRITE NEGATIVE 12/03/2017 2145   LEUKOCYTESUR NEGATIVE 12/03/2017 2145     RADIOLOGY: Dg Chest 1 View  Result Date: 12/03/2017 CLINICAL DATA:  Fluctuating blood sugar, chest pain EXAM: CHEST  1 VIEW COMPARISON:  11/28/2017 FINDINGS: Subsegmental atelectasis left base. No focal consolidation or effusion. Borderline to mild cardiomegaly. Aortic atherosclerosis. No pneumothorax. IMPRESSION: No active disease. Subsegmental atelectasis at the left base. Borderline cardiomegaly. Electronically Signed   By: Jasmine PangKim   Fujinaga M.D.   On: 12/03/2017 23:21    EKG: Orders placed or performed during the hospital encounter of 12/03/17  . ED EKG  . ED EKG    IMPRESSION AND PLAN:  1.  Acute DKA, likely secondary to noncompliance with medication and diet.  Will admit patient to intensive care unit and start insulin drip per protocol.  Continue to monitor clinically closely and repeat BMP every 4 hours.  2.  Elevated blood pressure.  Per patient, blood pressure is usually well controlled.  Will use IV hydralazine as needed to correct her blood pressure for now.  Continue to monitor BP closely.  Patient might need to be started on medication for hypertension, if BP would remain consistently elevated.  3.  Hypothyroidism, continue Synthroid.  4.  Peripheral neuropathy, on gabapentin.  All the records are reviewed and case discussed with ED provider. Management plans discussed with the patient, who is in agreement.  CODE STATUS: Full    Code Status Orders  (From admission, onward)         Start     Ordered   12/04/17 0241  Full code  Continuous     12/04/17 0240        Code Status History    Date Active Date Inactive Code Status Order ID Comments User Context   11/28/2017 0857 11/29/2017 1307 Full Code [id number]  Shaune Pollackhen, Qing, MD Inpatient       TOTAL TIME TAKING CARE OF THIS PATIENT: 50 minutes.    Cammy CopaAngela Isaul Landi M.D on 12/04/2017 at 2:43 AM  Between 7am to 6pm - Pager - [phone]  After 6pm go to [email or web address] - password [email or web address] [facility]  [facility] [place] [facility] Office  [phone]  CC: Primary care physician; Patient, No Pcp Per

## 2017-12-04 NOTE — Consult Note (Signed)
PULMONARY / CRITICAL CARE MEDICINE   Name: Deanna LordsRebecca Puello MRN: [id number] DOB: 04/02/1956    ADMISSION DATE:  12/03/2017 CONSULTATION DATE: 12/04/2017  REFERRING MD: Dr. Dutch GrayMayah  Reason: Severe hypoglycemia  HISTORY OF PRESENT ILLNESS:   62 year old female with uncontrolled type 2 diabetes presenting with severe hypoglycemia and nausea.  She was recently discharged from the hospital with similar symptoms.  Her labs in the ED showed a blood glucose level of 719 with an anion gap of 22.  He had a last hemoglobin A1c was 11.3.  Her urinalysis showed ketones.  She is being admitted to the ICU for DKA management  PAST MEDICAL HISTORY :  She  has a past medical history of Arthritis, Diabetes mellitus without complication (HCC), and Hypothyroidism.  PAST SURGICAL HISTORY: She  has a past surgical history that includes Replacement total knee bilateral and Total hip arthroplasty (Bilateral).  No Known Allergies  No current facility-administered medications on file prior to encounter.    Current Outpatient Medications on File Prior to Encounter  Medication Sig  . ALPRAZolam (XANAX) 0.25 MG tablet Take 0.25 mg by mouth at bedtime.   . furosemide (LASIX) 40 MG tablet Take 1 tablet (40 mg total) by mouth daily as needed.  Marland Kitchen. GABAPENTIN PO Take 1-2 tablets by mouth 2 (two) times daily. Take 1 tablet by mouth in the morning and 2 at bedtime.  . insulin aspart (NOVOLOG FLEXPEN) 100 UNIT/ML FlexPen Inject 3-20 Units into the skin 3 (three) times daily with meals. Sliding scale  . Insulin Glargine (BASAGLAR KWIKPEN) 100 UNIT/ML SOPN Inject 0.33 mLs (33 Units total) into the skin at bedtime.  Marland Kitchen. levothyroxine (SYNTHROID, LEVOTHROID) 175 MCG tablet Take 175 mcg by mouth daily.   . pioglitazone (ACTOS) 30 MG tablet Take 30 mg by mouth daily.   . pramipexole (MIRAPEX) 0.75 MG tablet Take 1-2 tablets by mouth 2 (two) times daily. Take 1 tablet by mouth in the morning and 2 at bedtime.   . traMADol (ULTRAM) 50 MG  tablet Take 1 tablet (50 mg total) by mouth every 6 (six) hours as needed for up to 5 days.    FAMILY HISTORY:  Her family history is not on file. She was adopted.  SOCIAL HISTORY: She  reports that she has never smoked. She has never used smokeless tobacco. She reports that she drank alcohol. She reports that she does not use drugs.  REVIEW OF SYSTEMS:   Unable to obtain as patient is restless and yelling SUBJECTIVE:    VITAL SIGNS: BP (!) 164/58   Pulse (!) 136   Temp 98.7 F (37.1 C) (Oral)   Resp (!) 29   Ht 5\' 3"  (1.6 m)   Wt 73 kg   SpO2 100%   BMI 28.51 kg/m   HEMODYNAMICS:    VENTILATOR SETTINGS:    INTAKE / OUTPUT: No intake/output data recorded.  PHYSICAL EXAMINATION: General: Moderate distress, restless, trying to get out of bed Neuro: Awake, not following commands, moving all extremities HEENT: PERRLA, no JVD Cardiovascular: Apical pulse tachycardic, S1-S2, no murmur regurg or gallop Lungs: Clear to auscultation bilaterally Abdomen: Nondistended, normal bowel sounds in all 4 quadrants Musculoskeletal: No joint deformities positive range of motion Skin: Warm and dry  LABS:  BMET Recent Labs  Lab 11/29/17 0544 12/03/17 2117 12/04/17 0151  NA 137 131* 135  K 4.5 4.2 4.5  CL 104 94* 100  CO2 25 15* 10*  BUN 14 31* 33*  CREATININE 0.60 0.95 1.20*  GLUCOSE 171* 719* 740*    Electrolytes Recent Labs  Lab 11/29/17 0544 12/03/17 2117 12/04/17 0151  CALCIUM 8.7* 9.3 9.1    CBC Recent Labs  Lab 11/28/17 0628 11/29/17 0544 12/03/17 2117  WBC 7.3 4.4 8.6  HGB 11.5* 10.5* 11.3*  HCT 35.6 31.8* 35.9  PLT 349 329 360    Coag's No results for input(s): APTT, INR in the last 168 hours.  Sepsis Markers No results for input(s): LATICACIDVEN, PROCALCITON, O2SATVEN in the last 168 hours.  ABG No results for input(s): PHART, PCO2ART, PO2ART in the last 168 hours.  Liver Enzymes Recent Labs  Lab 11/28/17 0628  AST 21  ALT 21   ALKPHOS 212*  BILITOT 1.4*  ALBUMIN 4.0    Cardiac Enzymes Recent Labs  Lab 11/28/17 1449 11/29/17 0544 12/03/17 2117  TROPONINI 0.03* <0.03 <0.03    Glucose Recent Labs  Lab 11/29/17 0009 11/29/17 0713 12/03/17 2352 12/04/17 0020 12/04/17 0201 12/04/17 0258  GLUCAP 186* 121* >600* >600* >600* 481*    Imaging Dg Chest 1 View  Result Date: 12/03/2017 CLINICAL DATA:  Fluctuating blood sugar, chest pain EXAM: CHEST  1 VIEW COMPARISON:  11/28/2017 FINDINGS: Subsegmental atelectasis left base. No focal consolidation or effusion. Borderline to mild cardiomegaly. Aortic atherosclerosis. No pneumothorax. IMPRESSION: No active disease. Subsegmental atelectasis at the left base. Borderline cardiomegaly. Electronically Signed   By: Jasmine PangKim  Fujinaga M.D.   On: 12/03/2017 23:21    SIGNIFICANT EVENTS: 12/03/2017  LINES/TUBES: Peripheral IV  DISCUSSION: 62 year old female with uncontrolled type 2 diabetes due to nonadherence presenting with recurrent DKA  ASSESSMENT DKA Acute encephalopathy with agitation Elevated BP Hypothyroidism   PLAN DKA protocol Sitter at bedside for safety Monitor blood pressure and give as needed hydralazine for systolic blood pressure greater than 160 Continue all home medications GI and DVT prophylaxis FAMILY  - Updates: No family at bedside.  Will update when available  Kaicee Scarpino S. [facility] ANP-BC Pulmonary and Critical Care Medicine [facility] Pager [phone] or [phone]  NB: This document was prepared using Dragon voice recognition software and may include unintentional dictation errors.   12/04/2017, 3:11 AM

## 2017-12-04 NOTE — Progress Notes (Signed)
Noted PICC order.  Spoke with floor RN Brad regarding consent this am and present time.  States no family present and unable to contact family at this time.  Will follow up later.

## 2017-12-04 NOTE — Progress Notes (Signed)
Pt sleeping at this time.  Precedex stopped due to low BP.
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
## 2017-12-04 NOTE — Care Management (Signed)
RNCM received notification that patient's new PCP appointment tomorrow with Leanora CoverLauren [name] FNP [facility] was cancelled. I have reached back out to [facility] for new appointment on Monday 12/11/17 at 0945AM  [phone].

## 2017-12-04 NOTE — Progress Notes (Signed)
1 amp D50 Admin.  BS 60

## 2017-12-05 ENCOUNTER — Ambulatory Visit: Payer: Medicare Other | Admitting: Family Medicine

## 2017-12-05 LAB — BASIC METABOLIC PANEL
ANION GAP: 3 — AB (ref 5–15)
BUN: 14 mg/dL (ref 8–23)
CHLORIDE: 118 mmol/L — AB (ref 98–111)
CO2: 19 mmol/L — AB (ref 22–32)
Calcium: 8.1 mg/dL — ABNORMAL LOW (ref 8.9–10.3)
Creatinine, Ser: 0.49 mg/dL (ref 0.44–1.00)
GFR calc Af Amer: >60 mL/min (ref >60)
GFR calc non Af Amer: >60 mL/min (ref >60)
GLUCOSE: 228 mg/dL — AB (ref 70–99)
POTASSIUM: 3.9 mmol/L (ref 3.5–5.1)
Sodium: 140 mmol/L (ref 135–145)

## 2017-12-05 LAB — GLUCOSE, CAPILLARY
GLUCOSE-CAPILLARY: 200 mg/dL — AB (ref 70–99)
GLUCOSE-CAPILLARY: 249 mg/dL — AB (ref 70–99)
Glucose-Capillary: 103 mg/dL — ABNORMAL HIGH (ref 70–99)
Glucose-Capillary: 219 mg/dL — ABNORMAL HIGH (ref 70–99)
Glucose-Capillary: 282 mg/dL — ABNORMAL HIGH (ref 70–99)
Glucose-Capillary: 38 mg/dL — CL (ref 70–99)
Glucose-Capillary: 59 mg/dL — ABNORMAL LOW (ref 70–99)
Glucose-Capillary: 84 mg/dL (ref 70–99)

## 2017-12-05 MED ORDER — INSULIN GLARGINE 100 UNIT/ML ~~LOC~~ SOLN: 35.0000 [IU] | Freq: Every day | SUBCUTANEOUS | Status: DC

## 2017-12-05 MED ORDER — LEVOTHYROXINE SODIUM 50 MCG PO TABS
175.0000 ug | ORAL_TABLET | Freq: Every day | ORAL | Status: DC
  Administered 2017-12-06: 175 ug via ORAL
  Filled 2017-12-05: qty 1

## 2017-12-05 MED ORDER — ALPRAZOLAM 0.25 MG PO TABS
0.2500 mg | ORAL_TABLET | Freq: Every day | ORAL | Status: DC
  Administered 2017-12-05: 0.25 mg via ORAL
  Filled 2017-12-05: qty 1

## 2017-12-05 MED ORDER — ENOXAPARIN SODIUM 40 MG/0.4ML ~~LOC~~ SOLN
40.0000 mg | SUBCUTANEOUS | Status: DC
  Administered 2017-12-05: 40 mg via SUBCUTANEOUS
  Filled 2017-12-05: qty 0.4

## 2017-12-05 MED ORDER — INSULIN GLARGINE 100 UNIT/ML ~~LOC~~ SOLN
35.0000 [IU] | Freq: Every day | SUBCUTANEOUS | Status: DC
  Administered 2017-12-05: 35 [IU] via SUBCUTANEOUS
  Filled 2017-12-05 (×2): qty 0.35

## 2017-12-05 NOTE — Progress Notes (Signed)
Inpatient Diabetes Program Recommendations  AACE/ADA: New Consensus Statement on Inpatient Glycemic Control (2019)  Target Ranges:  Prepandial:   less than 140 mg/dL      Peak postprandial:   less than 180 mg/dL (1-2 hours)      Critically ill patients:  140 - 180 mg/dL  Results for Dara LordsDAVIS, Raley (MRN [id number]) as of 12/05/2017 07:32  Ref. Range 12/05/2017 05:10  Glucose Latest Ref Range: 70 - 99 mg/dL 782228 (H)   Results for Dara LordsDAVIS, Emelly (MRN [id number]) as of 12/05/2017 07:32  Ref. Range 12/04/2017 12:49 12/04/2017 14:11 12/04/2017 15:32 12/04/2017 16:37 12/04/2017 17:52 12/04/2017 19:12 12/04/2017 20:24 12/04/2017 22:07 12/05/2017 00:05  Glucose-Capillary Latest Ref Range: 70 - 99 mg/dL 578347 (H) 469250 (H) 629138 (H) 89 157 (H) 221 (H) 228 (H) 314 (H) 282 (H)  Results for Dara LordsDAVIS, Charleene (MRN [id number]) as of 12/05/2017 07:32  Ref. Range 11/28/2017 09:13  Hemoglobin A1C Latest Ref Range: 4.8 - 5.6 % 9.5 (H)   Review of Glycemic Control Diabetes history: DM2 Outpatient Diabetes medications: Basaglar 33 units QHS, Novolog 3-20 units TID with meals, Actos 30 mg daily Current orders for Inpatient glycemic control: Lantus 33 units daily, Novolog 0-15 units TID with meals, Novolog 0-5 units QHS  Inpatient Diabetes Program Recommendations:  Insulin - Basal: Please consider increasing Lantus to 35 units daily. Insulin-Meal Coverage: Please consider ordering Novolog 4 units TID with meals for meal coverage if patient eats at least 50% of meals. HgbA1C: A1C 9.5% on 11/28/2017 indicating an average glucose of 226 mg/dl over the past 2-3 months.  Thanks, Orlando PennerMarie Braniyah Besse, RN, MSN, CDE Diabetes Coordinator Inpatient Diabetes Program [phone] (Team Pager from 8am to 5pm)

## 2017-12-05 NOTE — Progress Notes (Signed)
Report called to Zollie Scalelivia, RN on 2C. Patient transported via wheelchair to room 201 by Che[place]hasity, NT.

## 2017-12-05 NOTE — Progress Notes (Signed)
Follow up - Critical Care Medicine Note  Patient Details:    Deanna Hartman is an 62 y.o. female with a past medical history of uncontrolled diabetes, recurrent DKA, hypertension and hypothyroidism,presents with recurrent symptoms and elevated blood sugar. Her admission this time was complicated with altered mental status and hypotension.  Lines, Airways, Drains: External Urinary Catheter (Active)  Collection Container Dedicated Suction Canister 12/05/2017  4:00 AM  Securement Method Tape;Other (Comment) 12/04/2017  8:00 AM  Intervention Equipment Changed 12/04/2017 10:00 PM  Output (mL) 450 mL 12/05/2017  4:00 AM    Anti-infectives:  Anti-infectives (From admission, onward)   None      Microbiology: Results for orders placed or performed during the hospital encounter of 12/03/17  MRSA PCR Screening     Status: None   Collection Time: 12/04/17  1:09 AM  Result Value Ref Range Status   MRSA by PCR NEGATIVE NEGATIVE Final    Comment:        The GeneXpert MRSA Assay (FDA approved for NASAL specimens only), is one component of a comprehensive MRSA colonization surveillance program. It is not intended to diagnose MRSA infection nor to guide or monitor treatment for MRSA infections. Performed at [facility], [address] [place], [place] [address]   Studies: Dg Chest 1 View  Result Date: 12/03/2017 CLINICAL DATA:  Fluctuating blood sugar, chest pain EXAM: CHEST  1 VIEW COMPARISON:  11/28/2017 FINDINGS: Subsegmental atelectasis left base. No focal consolidation or effusion. Borderline to mild cardiomegaly. Aortic atherosclerosis. No pneumothorax. IMPRESSION: No active disease. Subsegmental atelectasis at the left base. Borderline cardiomegaly. Electronically Signed   By: Jasmine PangKim  Fujinaga M.D.   On: 12/03/2017 23:21   Dg Chest Portable 1 View  Result Date: 11/28/2017 CLINICAL DATA:  High blood sugar and abdominal pain. EXAM: PORTABLE CHEST 1 VIEW COMPARISON:  None.  FINDINGS: Shallow inspiration. Linear atelectasis in the mid lungs. No consolidation or airspace disease. No blunting of costophrenic angles. No pneumothorax. Normal heart size and pulmonary vascularity. Calcification of the aorta. IMPRESSION: Shallow inspiration with linear atelectasis in the mid lungs. Electronically Signed   By: Burman NievesWilliam  Stevens M.D.   On: 11/28/2017 06:39   [place] Ekg Site Rite  Result Date: 12/04/2017 If Site Rite image not attached, placement could not be confirmed due to current cardiac rhythm.   Consults:    Subjective:    Overnight Issues: Patient this morning looks great. Awake, alert, no acute distress, hypotension resolved, transition off the insulin infusion, presently eating.  Objective:  Vital signs for last 24 hours: Temp:  [98 F (36.7 C)-98.7 F (37.1 C)] 98 F (36.7 C) (08/20 0300) Pulse Rate:  [63-193] 67 (08/20 0600) Resp:  [9-24] 14 (08/20 0600) BP: (78-150)/(33-85) 114/69 (08/20 0600) SpO2:  [91 %-100 %] 100 % (08/20 0600) Weight:  [77 kg] 77 kg (08/20 0500)  Hemodynamic parameters for last 24 hours:    Intake/Output from previous day: 08/19 0701 - 08/20 0700 In: 4053.6 [I.V.:4043.6; IV Piggyback:10] Out: 1650 [Urine:1650]  Intake/Output this shift: No intake/output data recorded.  Physical Exam:   General: patient is awake, alert, oriented in no acute distress doing quite well this morning Neuro: Awake, moves all extremities, no focal deficits appreciated HEENT: PERRLA, no JVD Cardiovascular: Apical pulse tachycardic, S1-S2, no murmur regurg or gallop Lungs: Clear to auscultation bilaterally Abdomen: Nondistended, normal bowel sounds in all 4 quadrants Musculoskeletal: No joint deformities positive range of motion Skin: Warm and dry   Assessment/Plan:   DKA. Last blood  sugar is 219, anion gap has resolved less than 3. Transitioned off of IV insulin, switch to an oral diet. Has had diabetic teaching for slight rotation. Stable for  floor transfer  Hypotension resolved with fluid resuscitation, weaned off of pressors  Encephalopathy. Confusion has completely resolved patient is awake alert oriented no acute distress    Deanna Hartman 12/05/2017  *Care during the described time interval was provided by me and/or other providers on the critical care team.  I have reviewed this patient's available data, including medical history, events of note, physical examination and test results as part of my evaluation.

## 2017-12-05 NOTE — Progress Notes (Signed)
   [employer] [facility] [facility] [place] [facility]   PATIENT NAME: Deanna Hartman    MR#:  [id number]  DATE OF BIRTH:  03/15/1956  SUBJECTIVE:  Patient states she is feeling much better this morning. No nausea, no fevers.  REVIEW OF SYSTEMS:  Review of Systems  Constitutional: Negative for chills and fever.  HENT: Negative for congestion and sore throat.   Eyes: Negative for blurred vision and double vision.  Respiratory: Negative for cough and shortness of breath.   Cardiovascular: Negative for chest pain and palpitations.  Gastrointestinal: Negative for nausea and vomiting.  Genitourinary: Negative for dysuria and hematuria.  Musculoskeletal: Negative for back pain and myalgias.  Neurological: Negative for dizziness and headaches.    DRUG ALLERGIES:  No Known Allergies VITALS:  Blood pressure (!) 159/84, pulse 70, temperature 98.1 F (36.7 C), temperature source Oral, resp. rate 20, height 5\' 3"  (1.6 m), weight 77 kg, SpO2 100 %. PHYSICAL EXAMINATION:  Physical Exam  GENERAL:  62 y.o.-year-old patient lying in the bed with no acute distress. Talkative. EYES: Pupils equal, round, reactive to light and accommodation. No scleral icterus. Extraocular muscles intact.  HEENT: Head atraumatic, normocephalic. Oropharynx and nasopharynx clear. MMM. NECK:  Supple, no jugular venous distention. No thyroid enlargement, no tenderness.  LUNGS: CTAB, no wheezing, rales,rhonchi or crepitation. No use of accessory muscles of respiration.  CARDIOVASCULAR: RRR, S1, S2 normal. No S3/S4.  ABDOMEN: Soft, nontender, nondistended. Bowel sounds present. No organomegaly or mass.  EXTREMITIES: No pedal edema, cyanosis, or clubbing.  NEUROLOGIC: No focal weakness. PSYCHIATRIC: The patient is alert and oriented x 3.  SKIN: No obvious rash, lesion, or ulcer.   LABORATORY PANEL:  Female CBC Recent Labs  Lab 12/04/17 0448  WBC 13.1*  HGB 9.7*  HCT 29.1*  PLT 319    ------------------------------------------------------------------------------------------------------------------ Chemistries  Recent Labs  Lab 12/05/17 0510  NA 140  K 3.9  CL 118*  CO2 19*  GLUCOSE 228*  BUN 14  CREATININE 0.49  CALCIUM 8.1*   RADIOLOGY:  No results found. ASSESSMENT AND PLAN:   DKA with a history of T2DM- resolved. Likely related to medication non-compliance. - transitioned off insulin gtt 8/19 - continue lantus 35 units daily and moderate SSI  Hypotension- resolved, briefly on levophed, BPs now mildly elevated - monitor  Hypothyroidism- stable - continue home synthroid  Peripheral neuropathy- stable - continue home gabapentin  All the records are reviewed and case discussed with Care Management/Social Worker. Management plans discussed with the patient, family and they are in agreement.  CODE STATUS: Full Code  TOTAL TIME TAKING CARE OF THIS PATIENT: 32 minutes.   More than 50% of the time was spent in counseling/coordination of care: YES  POSSIBLE D/C tomorrow, DEPENDING ON CLINICAL CONDITION.   Jinny BlossomKaty D Mayo M.D on 12/05/2017 at 4:41 PM  Between 7am to 6pm - Pager - [phone]  After 6pm go to [email or web address] - Social research officer, governmentpassword [email or web address] [email or web address]  [facility]  Office  [phone]  CC: Primary care physician; Patient, No Pcp Per  Note: This dictation was prepared with Dragon dictation along with smaller phrase technology. Any transcriptional errors that result from this process are unintentional.

## 2017-12-06 ENCOUNTER — Other Ambulatory Visit: Payer: Self-pay

## 2017-12-06 LAB — GLUCOSE, CAPILLARY
GLUCOSE-CAPILLARY: 53 mg/dL — AB (ref 70–99)
GLUCOSE-CAPILLARY: 92 mg/dL (ref 70–99)
GLUCOSE-CAPILLARY: 71 mg/dL (ref 70–99)
Glucose-Capillary: 119 mg/dL — ABNORMAL HIGH (ref 70–99)

## 2017-12-06 MED ORDER — DOCUSATE SODIUM 100 MG PO CAPS: 100.0000 mg | ORAL_CAPSULE | Freq: Two times a day (BID) | ORAL | 0 refills | Status: DC

## 2017-12-06 MED ORDER — INSULIN GLARGINE 100 UNIT/ML ~~LOC~~ SOLN
25.0000 [IU] | Freq: Every day | SUBCUTANEOUS | Status: DC
  Filled 2017-12-06: qty 0.25

## 2017-12-06 MED ORDER — LISINOPRIL 5 MG PO TABS: 5.0000 mg | ORAL_TABLET | Freq: Every day | ORAL | 0 refills | Status: DC

## 2017-12-06 MED ORDER — LISINOPRIL 10 MG PO TABS
5.0000 mg | ORAL_TABLET | Freq: Every day | ORAL | Status: DC
  Administered 2017-12-06: 5 mg via ORAL
  Filled 2017-12-06: qty 1

## 2017-12-06 MED ORDER — BASAGLAR KWIKPEN 100 UNIT/ML ~~LOC~~ SOPN: 25.0000 [IU] | PEN_INJECTOR | Freq: Every day | SUBCUTANEOUS | 1 refills | Status: DC

## 2017-12-06 MED ORDER — GLUCOSE BLOOD VI STRP: ORAL_STRIP | 12 refills | Status: DC

## 2017-12-06 MED ORDER — HYDROCODONE-ACETAMINOPHEN 5-325 MG PO TABS: 1.0000 | ORAL_TABLET | Freq: Four times a day (QID) | ORAL | 0 refills | Status: DC | PRN

## 2017-12-06 MED ORDER — INSULIN GLARGINE 100 UNIT/ML ~~LOC~~ SOLN
28.0000 [IU] | Freq: Every day | SUBCUTANEOUS | Status: DC
  Filled 2017-12-06: qty 0.28

## 2017-12-06 MED ORDER — INSULIN GLARGINE 100 UNIT/ML ~~LOC~~ SOLN: 28.0000 [IU] | Freq: Every day | SUBCUTANEOUS | Status: DC

## 2017-12-06 NOTE — Care Management Important Message (Signed)
Copy of signed IM left with patient in room.  

## 2017-12-06 NOTE — Progress Notes (Signed)
Inpatient Diabetes Program Recommendations  AACE/ADA: New Consensus Statement on Inpatient Glycemic Control (2019)  Target Ranges:  Prepandial:   less than 140 mg/dL      Peak postprandial:   less than 180 mg/dL (1-2 hours)      Critically ill patients:  140 - 180 mg/dL   Results for Deanna Hartman, Deanna Hartman (MRN [id number]) as of 12/06/2017 07:27  Ref. Range 12/05/2017 07:48 12/05/2017 11:40 12/05/2017 17:05 12/05/2017 21:10 12/05/2017 21:52 12/05/2017 23:27 12/05/2017 23:53 12/06/2017 03:57 12/06/2017 04:38  Glucose-Capillary Latest Ref Range: 70 - 99 mg/dL 409219 (H) 811200 (H) 914249 (H) 59 (L) 84 38 (LL) 103 (H) 53 (L) 92   Review of Glycemic Control Diabetes history:DM2 Outpatient Diabetes medications:Basaglar 33 units QHS, Novolog 3-20 units TID with meals, Actos 30 mg daily Current orders for Inpatient glycemic control:Lantus 28 units daily, Novolog 0-15 units TID with meals, Novolog 0-5 units QHS  Inpatient Diabetes Program Recommendations: Insulin - Basal:Noted Lantus was decreased to 28 units daily to start today. Insulin-Correction: Please consider decreasing Novolog correction to sensitive scale (0-9 units). Insulin-Meal Coverage: Please consider ordering Novolog 2 units TID with meals for meal coverage if patient eats at least 50% of meals. HgbA1C:A1C 9.5% on 8/13/2019indicating an average glucose of 226 mg/dlover the past 2-3 months. Recommend patient establish care with local Endocrinologist for assistance with DM control.  NOTE: Patient received Lantus 35 units on 12/05/17 and has experienced hypoglycemia over the past 12 hours. Noted Lantus was decreased to 28 units. Recommend decreasing Novolog correction scale to sensitive scale as well and if patient is eating ordering Novolog 2 units TID with meals for meal coverage.  Thanks, Orlando PennerMarie Sherlyn Ebbert, RN, MSN, CDE Diabetes Coordinator Inpatient Diabetes Program [phone] (Team Pager from 8am to 5pm)

## 2017-12-06 NOTE — Discharge Instructions (Signed)
It was a pleasure meeting you this hospitalization!  You came into the hospital with very high blood sugars. You needed an insulin drip to get your blood sugars down. You did have a low blood sugar while you were here, so we decreased your lantus dose from 35 units to 25 units.  When you go home, you can continue taking the novolog as you were previously. You should take 25 units of basaglar.   Your blood pressures were high while you were in the hospital. I started a blood pressure medication called lisinopril. Please take this once daily.  We have scheduled an appointment with your primary care provider. Please make sure you go to this appointment.  -Dr. Nancy MarusMayo

## 2017-12-06 NOTE — Discharge Summary (Signed)
[facility] [place] [facility]   PATIENT NAME: Deanna Hartman    MR#:  [id number]  DATE OF BIRTH:  1955/09/21  DATE OF ADMISSION:  12/03/2017   ADMITTING PHYSICIAN: Cammy Copa, MD  DATE OF DISCHARGE: 12/06/17  PRIMARY CARE PHYSICIAN: Patient, No Pcp Per   ADMISSION DIAGNOSIS:  Diabetic ketoacidosis without coma associated with other specified diabetes mellitus (HCC) [E13.10] DISCHARGE DIAGNOSIS:  Active Problems:   DKA (diabetic ketoacidoses) (HCC)  SECONDARY DIAGNOSIS:   Past Medical History:  Diagnosis Date  . Arthritis   . Diabetes mellitus without complication (HCC)   . Hypothyroidism    HOSPITAL COURSE:   Deanna Hartman is a 62 year old female with a past medical history of uncontrolled type 2 diabetes and hypothyroidism who presented to the ED with hyperglycemia and nausea.  Initial glucose was 719, anion gap was 22, UA was positive for ketones.  Patient was admitted for DKA.  DKA with a history of T2DM-likely secondary to medication noncompliance. -Patient initially in the ICU on insulin drip -Transitioned off insulin gtt 8/19 -Placed on Lantus 35 units daily and moderate SSI -Had a low blood sugar to 38 due to not eating much dinner -Lantus decreased to 25 units daily.  Patient was discharged on this dose.  Hypotension then hypertension- resolved - briefly on levophed - BPs then became elevated to the 150s-160s systolics - started on lisinopril 5mg  daily on discharge - needs f/u bmp as outpatient  Hypothyroidism- stable - continued home synthroid  Peripheral neuropathy- stable - continued home gabapentin  DISCHARGE CONDITIONS:  DKA with a history of type 2 diabetes, resolved Hypertension Hypothyroidism Peripheral neuropathy CONSULTS OBTAINED:  CCM DRUG ALLERGIES:  No Known Allergies DISCHARGE MEDICATIONS:   Allergies as of 12/06/2017   No Known Allergies     Medication List    STOP taking these medications   traMADol 50  MG tablet Commonly known as:  ULTRAM     TAKE these medications   ACTOS 30 MG tablet Generic drug:  pioglitazone Take 30 mg by mouth daily.   ALPRAZolam 0.25 MG tablet Commonly known as:  XANAX Take 0.25 mg by mouth at bedtime.   BASAGLAR KWIKPEN 100 UNIT/ML Sopn Inject 0.25 mLs (25 Units total) into the skin at bedtime. What changed:  how much to take   docusate sodium 100 MG capsule Commonly known as:  COLACE Take 1 capsule (100 mg total) by mouth 2 (two) times daily.   furosemide 40 MG tablet Commonly known as:  LASIX Take 1 tablet (40 mg total) by mouth daily as needed.   GABAPENTIN PO Take 1-2 tablets by mouth 2 (two) times daily. Take 1 tablet by mouth in the morning and 2 at bedtime.   glucose blood test strip Check blood sugar three times daily.   HYDROcodone-acetaminophen 5-325 MG tablet Commonly known as:  NORCO/VICODIN Take 1 tablet by mouth every 6 (six) hours as needed for moderate pain.   levothyroxine 175 MCG tablet Commonly known as:  SYNTHROID, LEVOTHROID Take 175 mcg by mouth daily.   lisinopril 5 MG tablet Commonly known as:  PRINIVIL,ZESTRIL Take 1 tablet (5 mg total) by mouth daily.   NOVOLOG FLEXPEN 100 UNIT/ML FlexPen Generic drug:  insulin aspart Inject 3-20 Units into the skin 3 (three) times daily with meals. Sliding scale   pramipexole 0.75 MG tablet Commonly known as:  MIRAPEX Take 1-2 tablets by mouth 2 (two) times daily. Take 1 tablet by mouth in the morning and 2 at  bedtime.        DISCHARGE INSTRUCTIONS:  1.  Follow-up with PCP in 1 week 2.  Had a low blood sugar to 38.  Basaglar dose decreased to 25 units on discharge.  Was previously on 33 units at home.  Please follow-up blood sugars and adjust as needed. 2.  Had some elevated blood pressures during hospitalization.  Started on lisinopril 5 mg daily.  Needs repeat BMP as an outpatient. DIET:  Diabetic diet DISCHARGE CONDITION:  Stable ACTIVITY:  Activity as  tolerated OXYGEN:  Home Oxygen: No.  Oxygen Delivery: room air DISCHARGE LOCATION:  home   If you experience worsening of your admission symptoms, develop shortness of breath, life threatening emergency, suicidal or homicidal thoughts you must seek medical attention immediately by calling 911 or calling your MD immediately  if symptoms less severe.  You Must read complete instructions/literature along with all the possible adverse reactions/side effects for all the Medicines you take and that have been prescribed to you. Take any new Medicines after you have completely understood and accpet all the possible adverse reactions/side effects.   Please note  You were cared for by a hospitalist during your hospital stay. If you have any questions about your discharge medications or the care you received while you were in the hospital after you are discharged, you can call the unit and asked to speak with the hospitalist on call if the hospitalist that took care of you is not available. Once you are discharged, your primary care physician will handle any further medical issues. Please note that NO REFILLS for any discharge medications will be authorized once you are discharged, as it is imperative that you return to your primary care physician (or establish a relationship with a primary care physician if you do not have one) for your aftercare needs so that they can reassess your need for medications and monitor your lab values.    On the day of Discharge:  VITAL SIGNS:  Blood pressure (!) 159/82, pulse 74, temperature 97.6 F (36.4 C), temperature source Oral, resp. rate 20, height 5\' 3"  (1.6 m), weight 75 kg, SpO2 100 %. PHYSICAL EXAMINATION:  GENERAL:  62 y.o.-year-old patient lying in the bed with no acute distress.  EYES: Pupils equal, round, reactive to light and accommodation. No scleral icterus. Extraocular muscles intact.  HEENT: Head atraumatic, normocephalic. Oropharynx and nasopharynx  clear.  NECK:  Supple, no jugular venous distention. No thyroid enlargement, no tenderness.  LUNGS: Normal breath sounds bilaterally, no wheezing, rales,rhonchi or crepitation. No use of accessory muscles of respiration.  CARDIOVASCULAR: S1, S2 normal. No murmurs, rubs, or gallops.  ABDOMEN: Soft, non-tender, non-distended. Bowel sounds present. No organomegaly or mass.  EXTREMITIES: No pedal edema, cyanosis, or clubbing.  NEUROLOGIC: Cranial nerves II through XII are intact. Muscle strength 5/5 in all extremities. Sensation intact. Gait not checked.  PSYCHIATRIC: The patient is alert and oriented x 3.  SKIN: No obvious rash, lesion, or ulcer.  DATA REVIEW:   CBC Recent Labs  Lab 12/04/17 0448  WBC 13.1*  HGB 9.7*  HCT 29.1*  PLT 319    Chemistries  Recent Labs  Lab 12/05/17 0510  NA 140  K 3.9  CL 118*  CO2 19*  GLUCOSE 228*  BUN 14  CREATININE 0.49  CALCIUM 8.1*     Microbiology Results  Results for orders placed or performed during the hospital encounter of 12/03/17  MRSA PCR Screening     Status: None  Collection Time: 12/04/17  1:09 AM  Result Value Ref Range Status   MRSA by PCR NEGATIVE NEGATIVE Final    Comment:        The GeneXpert MRSA Assay (FDA approved for NASAL specimens only), is one component of a comprehensive MRSA colonization surveillance program. It is not intended to diagnose MRSA infection nor to guide or monitor treatment for MRSA infections. Performed at [facility], [address] [place], [place] [address]     RADIOLOGY:  No results found.   Management plans discussed with the patient, family and they are in agreement.  CODE STATUS: Full Code   TOTAL TIME TAKING CARE OF THIS PATIENT: 35 minutes.    Jinny BlossomKaty D Janellie Tennison M.D on 12/06/2017 at 8:39 AM  Between 7am to 6pm - Pager - [phone]  After 6pm go to [email or web address] - Social research officer, governmentpassword EPAS [email or web address]  [facility]  Office  [phone]  CC:  Primary care physician; Patient, No Pcp Per   Note: This dictation was prepared with Dragon dictation along with smaller phrase technology. Any transcriptional errors that result from this process are unintentional.

## 2017-12-06 NOTE — Progress Notes (Signed)
Discharge teaching given to patient, patient verbalized understanding and had no questions. Patient IV removed. Patient will be transported home by family. All patient belongings gathered prior to leaving.  

## 2017-12-11 ENCOUNTER — Encounter: Payer: Self-pay | Admitting: Family Medicine

## 2017-12-11 ENCOUNTER — Ambulatory Visit (INDEPENDENT_AMBULATORY_CARE_PROVIDER_SITE_OTHER): Payer: Medicare Other | Admitting: Family Medicine

## 2017-12-11 VITALS — BP 144/80 | HR 79 | Temp 98.2°F | Resp 18 | Ht 62.0 in | Wt 161.2 lb

## 2017-12-11 DIAGNOSIS — M1711 Unilateral primary osteoarthritis, right knee: Secondary | ICD-10-CM

## 2017-12-11 DIAGNOSIS — E11649 Type 2 diabetes mellitus with hypoglycemia without coma: Secondary | ICD-10-CM | POA: Diagnosis not present

## 2017-12-11 DIAGNOSIS — Z794 Long term (current) use of insulin: Secondary | ICD-10-CM

## 2017-12-11 DIAGNOSIS — E1110 Type 2 diabetes mellitus with ketoacidosis without coma: Secondary | ICD-10-CM | POA: Diagnosis not present

## 2017-12-11 DIAGNOSIS — E039 Hypothyroidism, unspecified: Secondary | ICD-10-CM | POA: Diagnosis not present

## 2017-12-11 MED ORDER — BASAGLAR KWIKPEN 100 UNIT/ML ~~LOC~~ SOPN: 15.0000 [IU] | PEN_INJECTOR | Freq: Every day | SUBCUTANEOUS | 1 refills | Status: AC

## 2017-12-11 MED ORDER — METFORMIN HCL 500 MG PO TABS: ORAL_TABLET | ORAL | 3 refills | Status: AC

## 2017-12-11 MED ORDER — HYDROCODONE-ACETAMINOPHEN 5-325 MG PO TABS: 1.0000 | ORAL_TABLET | Freq: Four times a day (QID) | ORAL | 0 refills | Status: DC | PRN

## 2017-12-11 NOTE — Patient Instructions (Addendum)
STOP Actos and insulin sliding scale  DECREASE basaglar insulin to 15 units at bedtime  Start metformin 500mg  once daily at dinner for 1 week, then increase to 500mg  with breakfast and dinner.
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
## 2017-12-12 ENCOUNTER — Other Ambulatory Visit: Payer: Self-pay | Admitting: Family Medicine

## 2017-12-12 ENCOUNTER — Telehealth: Payer: Self-pay | Admitting: Family Medicine

## 2017-12-12 ENCOUNTER — Other Ambulatory Visit: Payer: Self-pay

## 2017-12-12 DIAGNOSIS — M25551 Pain in right hip: Secondary | ICD-10-CM

## 2017-12-12 MED ORDER — BLOOD GLUCOSE METER KIT: PACK | 0 refills | Status: DC

## 2017-12-12 MED ORDER — GLUCOSE BLOOD VI STRP: ORAL_STRIP | 12 refills | Status: DC

## 2017-12-12 MED ORDER — GLUCOSE BLOOD VI STRP: ORAL_STRIP | 12 refills | Status: AC

## 2017-12-12 NOTE — Telephone Encounter (Signed)
Request for MRI 

## 2017-12-12 NOTE — Telephone Encounter (Signed)
Called and left voicemail for patient to call office back to let us know what is the name of the glucose meter she uses so that we can send in the proper test strips. [facility]

## 2017-12-12 NOTE — Telephone Encounter (Signed)
Patient advised of below. She states she is ok with doing xray first of hip. She has appointment on 12/19/17
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
## 2017-12-12 NOTE — Telephone Encounter (Signed)
This was meant to come to you :)

## 2017-12-12 NOTE — Telephone Encounter (Signed)
Copied from CRM [phone]#[id number]. Topic: Quick Communication - See Telephone Encounter >> Dec 12, 2017 10:12 AM Tamela OddiMartin, Don'[place], NT wrote: CRM for notification. See Telephone encounter for: 12/12/17. Patient called and states she had to replace her Glucose meter and brought one from [facility]. She states she talked to the provider yesterday on send ing a new RX for test strips. She is checking her blood sugar about 10 times a day and is running out of test strips fast.\\  [id number] [place], [place] - [address] [phone] (Phone) [phone] (Fax)

## 2017-12-12 NOTE — Telephone Encounter (Signed)
See attached pt request.   She said she talked with the provider yesterday (8/26) about needing test strips for her new glucose machine. PCP:  Leanora CoverLauren [name], FNP

## 2017-12-12 NOTE — Telephone Encounter (Signed)
Pt calling states that she needs a meter and test strips called in and she wants to use the FreeStyle type. [facility] [place]

## 2017-12-12 NOTE — Telephone Encounter (Signed)
Patient states on the front it says [facility], she bought it over the counter.

## 2017-12-12 NOTE — Telephone Encounter (Signed)
Rx for test strips [facility] to [facility]
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
## 2017-12-13 ENCOUNTER — Telehealth: Payer: Self-pay | Admitting: *Deleted

## 2017-12-13 DIAGNOSIS — M25551 Pain in right hip: Secondary | ICD-10-CM

## 2017-12-13 DIAGNOSIS — M1711 Unilateral primary osteoarthritis, right knee: Secondary | ICD-10-CM

## 2017-12-13 MED ORDER — BLOOD GLUCOSE METER KIT: PACK | 0 refills | Status: AC

## 2017-12-13 NOTE — Telephone Encounter (Signed)
Copied from CRM [phone]. Topic: General - Other >> Dec 12, 2017  4:19 PM Percival SpanishKennedy, Cheryl W wrote:  Pt insurance will not cover glucose blood (GLUCOSE METER TEST) test strip and is asking if another kind iof meter can be called in   [facility] [place]

## 2017-12-13 NOTE — Telephone Encounter (Signed)
Sent to pharmacy on 12/12/17

## 2017-12-13 NOTE — Telephone Encounter (Signed)
Patient notified of hip x ray order and that she can have this done at the hospital.

## 2017-12-13 NOTE — Telephone Encounter (Signed)
Patient says the hydrocodone is not helping her pain that its still rated at 6 with Hydrocodone, hurts so bad that she is not able to sleep at night ,  She says before she was on percocet 10/325 that was what controlled her pain , patient is willing to come into office and she is willing to get X-ray if needed,  Patient insurance would not pay for the one touch meter , but will cover a Free style meter script sent as requested.

## 2017-12-13 NOTE — Telephone Encounter (Signed)
I put in xray order - I did it for the hospital because you dont need to have an appt there. Also, I am not willing to increase pain medication dose. Narcotics are addicting and are not meant to be used for chronic pain. I can do a pain management referral if she would like.

## 2017-12-13 NOTE — Telephone Encounter (Signed)
Patient would like to be referred to pain management. 

## 2017-12-13 NOTE — Telephone Encounter (Signed)
Pain management referral is in.   I am hopeful either orthopedic or pain management can do an injection of some sort to help pain

## 2017-12-13 NOTE — Telephone Encounter (Signed)
Hip xray order in - can get done at the hospital

## 2017-12-14 ENCOUNTER — Telehealth: Payer: Self-pay

## 2017-12-14 NOTE — Telephone Encounter (Signed)
Patient notified referral in place. 

## 2017-12-14 NOTE — Telephone Encounter (Signed)
Copied from [place] [id number]. Topic: Quick Communication - See Telephone Encounter >> Dec 13, 2017  4:58 PM Antonieta Iba C wrote: CRM for notification. See Telephone encounter for: 12/13/17.  Pt says that she spoke with pharmacy and they are stating that the meter and kit was not sent in. Pt would like to know if office could call pharmacy and look into it for her?   Please advise.  Called patient to inform her that an RX for meter was sent to pharmacy and did she pick it up. Patient states that she did not need to because the pharmacist ran her insurance and was able to get test strips for the meter she already has. I informed her if she needed anything else then give the office a call.

## 2017-12-19 ENCOUNTER — Encounter: Payer: Self-pay | Admitting: Family Medicine

## 2017-12-19 ENCOUNTER — Ambulatory Visit
Admission: RE | Admit: 2017-12-19 | Discharge: 2017-12-19 | Disposition: A | Discharge status: Home / Self Care | Payer: Medicare Other | Source: Ambulatory Visit | Attending: Family Medicine | Admitting: Family Medicine

## 2017-12-19 ENCOUNTER — Other Ambulatory Visit: Payer: Self-pay | Admitting: Family Medicine

## 2017-12-19 ENCOUNTER — Ambulatory Visit (INDEPENDENT_AMBULATORY_CARE_PROVIDER_SITE_OTHER): Payer: Medicare Other | Admitting: Family Medicine

## 2017-12-19 ENCOUNTER — Ambulatory Visit: Admission: RE | Admit: 2017-12-19 | Payer: Medicare Other | Source: Ambulatory Visit

## 2017-12-19 VITALS — BP 142/86 | HR 82 | Temp 97.7°F | Ht 63.0 in | Wt 163.0 lb

## 2017-12-19 DIAGNOSIS — K769 Liver disease, unspecified: Secondary | ICD-10-CM

## 2017-12-19 DIAGNOSIS — D649 Anemia, unspecified: Secondary | ICD-10-CM

## 2017-12-19 DIAGNOSIS — E039 Hypothyroidism, unspecified: Secondary | ICD-10-CM | POA: Diagnosis not present

## 2017-12-19 DIAGNOSIS — Y838 Other surgical procedures as the cause of abnormal reaction of the patient, or of later complication, without mention of misadventure at the time of the procedure: Secondary | ICD-10-CM | POA: Diagnosis not present

## 2017-12-19 DIAGNOSIS — M25551 Pain in right hip: Secondary | ICD-10-CM | POA: Insufficient documentation

## 2017-12-19 DIAGNOSIS — E11649 Type 2 diabetes mellitus with hypoglycemia without coma: Secondary | ICD-10-CM

## 2017-12-19 DIAGNOSIS — Z1159 Encounter for screening for other viral diseases: Secondary | ICD-10-CM

## 2017-12-19 DIAGNOSIS — Z96642 Presence of left artificial hip joint: Secondary | ICD-10-CM | POA: Insufficient documentation

## 2017-12-19 DIAGNOSIS — R748 Abnormal levels of other serum enzymes: Secondary | ICD-10-CM

## 2017-12-19 DIAGNOSIS — T84010A Broken internal right hip prosthesis, initial encounter: Secondary | ICD-10-CM | POA: Diagnosis not present

## 2017-12-19 DIAGNOSIS — M1711 Unilateral primary osteoarthritis, right knee: Secondary | ICD-10-CM

## 2017-12-19 DIAGNOSIS — Z794 Long term (current) use of insulin: Secondary | ICD-10-CM

## 2017-12-19 LAB — COMPREHENSIVE METABOLIC PANEL
ALBUMIN: 4 g/dL (ref 3.5–5.2)
ALT: 28 U/L (ref 0–35)
AST: 28 U/L (ref 0–37)
Alkaline Phosphatase: 163 U/L — ABNORMAL HIGH (ref 39–117)
BUN: 20 mg/dL (ref 6–23)
CALCIUM: 9.7 mg/dL (ref 8.4–10.5)
CHLORIDE: 98 meq/L (ref 96–112)
CO2: 23 meq/L (ref 19–32)
CREATININE: 0.75 mg/dL (ref 0.40–1.20)
GFR: 82.52 mL/min (ref >60.00)
Glucose, Bld: 218 mg/dL — ABNORMAL HIGH (ref 70–99)
Potassium: 5.5 mEq/L — ABNORMAL HIGH (ref 3.5–5.1)
Sodium: 132 mEq/L — ABNORMAL LOW (ref 135–145)
Total Bilirubin: 0.3 mg/dL (ref 0.2–1.2)
Total Protein: 7.1 g/dL (ref 6.0–8.3)

## 2017-12-19 LAB — TSH: TSH: 3.32 u[IU]/mL (ref 0.35–4.50)

## 2017-12-19 MED ORDER — HYDROCODONE-ACETAMINOPHEN 5-325 MG PO TABS: 1.0000 | ORAL_TABLET | Freq: Four times a day (QID) | ORAL | 0 refills | Status: DC | PRN

## 2017-12-19 NOTE — Progress Notes (Signed)
Subjective:    Patient ID: Deanna Hartman, female    DOB: 07/09/1955, 62 y.o.   MRN: [id number]  HPI  Presents to clinic for follow up on DM after making medication changes. She was having many lows and also highs on combo of actos, sliding scale mealtime insulin and basaglar insulin.  We stopped the actos, advised to hold mealtime insulin and decreased the basaglar dose from 25 units at HS to 15 units at HS and also added metformin.  The change over to metformin has been going well - no GI side effects.   States she has still been using the mealtime insulin due to having readings over 200 at meals. She has been using fewer units at mealtimes, but states she has many different sliding scales from over the years and is unsure of what to take.  She continues to have pain in right knee due to severe OA and she also has been having right hip pain -- has a hx of hip replacements bilaterally. She has upcoming orthopedic appt this month.  Patient Active Problem List   Diagnosis Date Noted  . Osteoarthritis of right knee 12/19/2017  . Right hip pain 12/19/2017  . Type 2 diabetes mellitus with hypoglycemia without coma, with long-term current use of insulin (HCC) 12/19/2017  . Hypothyroidism 12/19/2017  . DKA (diabetic ketoacidoses) (HCC) 12/03/2017  . Hyperglycemia 11/29/2017  . Hyperosmolar non-ketotic state in patient with type 2 diabetes mellitus (HCC) 11/28/2017   Social History   Tobacco Use  . Smoking status: Former Games developer  . Smokeless tobacco: Never Used  Substance Use Topics  . Alcohol use: Not Currently    Frequency: Never   Review of Systems     Objective:   Physical Exam  Constitutional: She is oriented to person, place, and time. She appears well-developed and well-nourished. No distress.  HENT:  Head: Normocephalic and atraumatic.  Eyes: Conjunctivae and EOM are normal. No scleral icterus.  Neck: Normal range of motion. Neck supple.  Cardiovascular: Normal rate and  regular rhythm.  Pulmonary/Chest: Effort normal and breath sounds normal. No respiratory distress.  Musculoskeletal: She exhibits no edema.  Walks with cane due to severe OA in right knee, cannot fully bend or extend. Pain in right hip with adduction and abduction of joint.   Neurological: She is alert and oriented to person, place, and time.  Skin: Skin is warm and dry. No rash noted. She is not diaphoretic. No pallor.  Psychiatric: She has a normal mood and affect. Her behavior is normal.  Nursing note and vitals reviewed.  Vitals:   12/19/17 0933  BP: (!) 142/86  Pulse: 82  Temp: 97.7 F (36.5 C)  SpO2: 95%      Assessment & Plan:   Type 2 diabetes-endocrine referral was placed at last visit, appointment is currently pending.  She will continue Basaglar 15 units at bedtime, metformin twice daily and will do low-dose NovoLog insulin sliding scale with meals.  Patient will continue to check blood sugars throughout the day and keep a log of readings.  Patient is aware of what to do instances of hypo-and hyperglycemia.  Right hip pain-we will get x-ray to further assess right hip pain.  She has history of hip replacement and bilateral hips.  Right knee pain-this is related to severe osteoarthritis, patient has been told in the past she will need knee replacement at some point in the future.  Patient has upcoming orthopedic appointment and also pain clinic referral has  been placed.  Patient given 10 tablets of Norco to use as needed for more severe pain.  Patient aware that narcotics are not good to take chronically for pain control.  Hypothyroidism-patient currently takes levothyroxine 150 mcg for hypothyroidism.  Lab work was ordered at last visit, but was unable to be obtained by phlebotomist at that time.  Thyroid level and CMP drawn in clinic today.  She will follow-up in 2 months.

## 2017-12-20 ENCOUNTER — Other Ambulatory Visit: Payer: Self-pay | Admitting: Family Medicine

## 2017-12-20 DIAGNOSIS — R748 Abnormal levels of other serum enzymes: Secondary | ICD-10-CM

## 2017-12-20 NOTE — Addendum Note (Signed)
Addended by: Leanora Cover on: 12/20/2017 12:08 PM   Modules accepted: Orders

## 2017-12-20 NOTE — Progress Notes (Signed)
CBC order and hepatic function panel order in

## 2017-12-22 ENCOUNTER — Ambulatory Visit: Payer: Self-pay | Admitting: *Deleted

## 2017-12-22 ENCOUNTER — Telehealth: Payer: Self-pay

## 2017-12-22 NOTE — Telephone Encounter (Addendum)
Pt called back to say that her blood sugar is now 322 and feels like she does not need to go to the hospital. Still advising the patient to go to the hospital to be seen. It could be a repeat tomorrow if she does not go and be assessed and to go over her medications.  Pt voiced understanding.

## 2017-12-22 NOTE — Telephone Encounter (Signed)
Patient triaged by [facility] nurse and advised to go to ED. Patient is going to ED.

## 2017-12-22 NOTE — Telephone Encounter (Signed)
See previous triage encounter dated 12/22/17 at 1441; also see note dated 12/22/17 at 1501; attempted to contact pt regarding blood sugar; no answer at [phone]; will route to office for notification that the pt has decided not to go to hospital since her blood sugar dropped to 322; unable to gather any further information at this time.   Message from Gerrianne Scale sent at 12/22/2017 3:08 PM EDT   Pt calling back stating that her blood sugar has went down to 322 she just checked it pt was going to hospital and changed her mind because it had went down   Call History    Type Contact Phone User  12/22/2017 03:07 PM Phone (Incoming) Deanna, Hartman (Self) [phone] Rexene Edison) Gerrianne Scale    Reason for Disposition . [1] Follow-up call to recent contact AND [2] information only call, no triage required  Protocols used: INFORMATION ONLY CALL-A-AH

## 2017-12-22 NOTE — Telephone Encounter (Signed)
Copied from CRM [id number]. Topic: General - Call Back - No Documentation >> Dec 22, 2017 11:35 AM Tamela Oddi, NT wrote: Reason for CRM: Patient called and states she just received a miss call. No documentation CB# [phone]

## 2017-12-22 NOTE — Telephone Encounter (Signed)
Pt called with an elevated blood sugar. She checked it this morning and it had been up to 500 she gave herself 20 units of sliding scale insulin.  She has not had much to eat today and now her blood sugar is 468 and now she has given herself a total of 40 units.  She has been out of her night time insulin, the basaglar Stephanie Coup and has not been able to take this for the last few nights. She is having a problem with her insurance company regarding this med so wont be able to refill this until Sept 9 th.  She has taken her metformin as prescribed.  She is starting to get heart burn and urinating pretty frequently. Per protocol, she will be going to the emergency department to be treated.  Will route this encounter to flow at [facility] at [facility].  Reason for Disposition . Patient sounds very sick or weak to the triager  Answer Assessment - Initial Assessment Questions 1. BLOOD GLUCOSE: "What is your blood glucose level?"      468 2. ONSET: "When did you check the blood glucose?"     Right after lunch 3. USUAL RANGE: "What is your glucose level usually?" (e.g., usual fasting morning value, usual evening value)     139-160 4. KETONES: "Do you check for ketones (urine or blood test strips)?" If yes, ask: "What does the test show now?"      no 5. TYPE 1 or 2:  "Do you know what type of diabetes you have?"  (e.g., Type 1, Type 2, Gestational; doesn't know)      Type 2 6. INSULIN: "Do you take insulin?" "What type of insulin(s) do you use? What is the mode of delivery? (syringe, pen (e.g., injection or  pump)?"      Yes  7. DIABETES PILLS: "Do you take any pills for your diabetes?" If yes, ask: "Have you missed taking any pills recently?"     metformin 8. OTHER SYMPTOMS: "Do you have any symptoms?" (e.g., fever, frequent urination, difficulty breathing, dizziness, weakness, vomiting)     Heart burn  Protocols used: DIABETES - HIGH BLOOD SUGAR-A-AH

## 2017-12-22 NOTE — Telephone Encounter (Signed)
See previous triage note patient advised by [facility] nurse to go to ED. Patient states that she is going.

## 2017-12-22 NOTE — Telephone Encounter (Signed)
Patient called, she says her blood at 1500 was down to 322 and now it is 104. She says she did not go to the ED and she will watch her diet real close this weekend.

## 2017-12-27 ENCOUNTER — Other Ambulatory Visit (INDEPENDENT_AMBULATORY_CARE_PROVIDER_SITE_OTHER): Payer: Medicare Other

## 2017-12-27 ENCOUNTER — Other Ambulatory Visit: Payer: Self-pay | Admitting: Family Medicine

## 2017-12-27 ENCOUNTER — Telehealth: Payer: Self-pay

## 2017-12-27 DIAGNOSIS — M1711 Unilateral primary osteoarthritis, right knee: Secondary | ICD-10-CM

## 2017-12-27 DIAGNOSIS — M25551 Pain in right hip: Principal | ICD-10-CM

## 2017-12-27 DIAGNOSIS — G8929 Other chronic pain: Secondary | ICD-10-CM

## 2017-12-27 DIAGNOSIS — R748 Abnormal levels of other serum enzymes: Secondary | ICD-10-CM | POA: Diagnosis not present

## 2017-12-27 LAB — CBC WITH DIFFERENTIAL/PLATELET
BASOS ABS: 0 10*3/uL (ref 0.0–0.1)
Basophils Relative: 1 % (ref 0.0–3.0)
EOS ABS: 0.2 10*3/uL (ref 0.0–0.7)
Eosinophils Relative: 3.7 % (ref 0.0–5.0)
HEMATOCRIT: 35.6 % — AB (ref 36.0–46.0)
HEMOGLOBIN: 11.4 g/dL — AB (ref 12.0–15.0)
LYMPHS PCT: 43.8 % (ref 12.0–46.0)
Lymphs Abs: 1.9 10*3/uL (ref 0.7–4.0)
MCHC: 31.9 g/dL (ref 30.0–36.0)
MCV: 86.9 fl (ref 78.0–100.0)
MONOS PCT: 4.8 % (ref 3.0–12.0)
Monocytes Absolute: 0.2 10*3/uL (ref 0.1–1.0)
NEUTROS ABS: 2 10*3/uL (ref 1.4–7.7)
Neutrophils Relative %: 46.7 % (ref 43.0–77.0)
PLATELETS: 422 10*3/uL — AB (ref 150.0–400.0)
RBC: 4.09 Mil/uL (ref 3.87–5.11)
RDW: 16.7 % — ABNORMAL HIGH (ref 11.5–15.5)
WBC: 4.3 10*3/uL (ref 4.0–10.5)

## 2017-12-27 MED ORDER — HYDROCODONE-ACETAMINOPHEN 5-325 MG PO TABS: 1.0000 | ORAL_TABLET | Freq: Four times a day (QID) | ORAL | 0 refills | Status: DC | PRN

## 2017-12-27 NOTE — Telephone Encounter (Signed)
Called patient and she states that she has an appointment with ortho on 01-01-18 and has not gotten an appointment with pain management yet. I informed her that 10 more tablets would be sent in. Patient verbalized understanding.

## 2017-12-27 NOTE — Progress Notes (Signed)
Hepatic panel ordered

## 2017-12-27 NOTE — Telephone Encounter (Signed)
Do we know when pain management and/or ortho appts are?  I can do one more 10 tablet supply  LG

## 2017-12-27 NOTE — Addendum Note (Signed)
Addended by: Leanora Cover on: 12/27/2017 05:04 PM   Modules accepted: Orders

## 2017-12-27 NOTE — Telephone Encounter (Signed)
Copied from CRM [id number]. Topic: General - Other >> Dec 27, 2017 10:49 AM Gean Birchwood R wrote: Pt requested HYDROcodone-acetaminophen (NORCO/VICODIN) 5-325 MG tablet it was prescribed on 12/19/2017 for 10 tablets prn and she states she doesn't have anymore and would like a refill

## 2018-01-02 ENCOUNTER — Telehealth: Payer: Self-pay

## 2018-01-02 NOTE — Telephone Encounter (Signed)
Copied from CRM [id number]. Topic: General - Other >> Jan 01, 2018  8:27 AM Percival SpanishKennedy, Cheryl W wrote:  Would like a call back about labs she had on 12/27/17  Called patient back unable to leave voicemail

## 2018-01-03 ENCOUNTER — Ambulatory Visit: Payer: Medicare Other | Admitting: Nurse Practitioner

## 2018-01-04 ENCOUNTER — Ambulatory Visit
Admission: RE | Admit: 2018-01-04 | Discharge: 2018-01-04 | Disposition: A | Discharge status: Home / Self Care | Payer: Medicare Other | Source: Ambulatory Visit | Attending: Nurse Practitioner | Admitting: Nurse Practitioner

## 2018-01-04 ENCOUNTER — Other Ambulatory Visit: Payer: Self-pay

## 2018-01-04 ENCOUNTER — Encounter: Payer: Self-pay | Admitting: Nurse Practitioner

## 2018-01-04 ENCOUNTER — Telehealth: Payer: Self-pay

## 2018-01-04 ENCOUNTER — Ambulatory Visit (HOSPITAL_BASED_OUTPATIENT_CLINIC_OR_DEPARTMENT_OTHER): Payer: Medicare Other | Admitting: Nurse Practitioner

## 2018-01-04 VITALS — BP 149/60 | HR 84 | Temp 98.2°F | Resp 18 | Ht 63.0 in | Wt 160.0 lb

## 2018-01-04 DIAGNOSIS — M79604 Pain in right leg: Secondary | ICD-10-CM

## 2018-01-04 DIAGNOSIS — M25561 Pain in right knee: Principal | ICD-10-CM

## 2018-01-04 DIAGNOSIS — M1711 Unilateral primary osteoarthritis, right knee: Secondary | ICD-10-CM | POA: Insufficient documentation

## 2018-01-04 DIAGNOSIS — E039 Hypothyroidism, unspecified: Secondary | ICD-10-CM | POA: Insufficient documentation

## 2018-01-04 DIAGNOSIS — E11649 Type 2 diabetes mellitus with hypoglycemia without coma: Secondary | ICD-10-CM | POA: Insufficient documentation

## 2018-01-04 DIAGNOSIS — M25551 Pain in right hip: Secondary | ICD-10-CM

## 2018-01-04 DIAGNOSIS — G8929 Other chronic pain: Secondary | ICD-10-CM | POA: Insufficient documentation

## 2018-01-04 DIAGNOSIS — Z79899 Other long term (current) drug therapy: Secondary | ICD-10-CM

## 2018-01-04 DIAGNOSIS — Z79891 Long term (current) use of opiate analgesic: Secondary | ICD-10-CM

## 2018-01-04 DIAGNOSIS — M85872 Other specified disorders of bone density and structure, left ankle and foot: Secondary | ICD-10-CM | POA: Insufficient documentation

## 2018-01-04 DIAGNOSIS — M79672 Pain in left foot: Secondary | ICD-10-CM

## 2018-01-04 DIAGNOSIS — M899 Disorder of bone, unspecified: Secondary | ICD-10-CM

## 2018-01-04 DIAGNOSIS — G894 Chronic pain syndrome: Secondary | ICD-10-CM

## 2018-01-04 DIAGNOSIS — Z789 Other specified health status: Secondary | ICD-10-CM

## 2018-01-04 NOTE — Patient Instructions (Addendum)
You will have x rays completed prior to next appt. with pain clinic. ____________________________________________________________________________________________  Appointment Policy Summary  It is our goal and responsibility to provide the medical community with assistance in the evaluation and management of patients with chronic pain. Unfortunately our resources are limited. Because we do not have an unlimited amount of time, or available appointments, we are required to closely monitor and manage their use. The following rules exist to maximize their use:  Patient's responsibilities: 1. Punctuality:  At what time should I arrive? You should be physically present in our office 30 minutes before your scheduled appointment. Your scheduled appointment is with your assigned healthcare provider. However, it takes 5-10 minutes to be "checked-in", and another 15 minutes for the nurses to do the admission. If you arrive to our office at the time you were given for your appointment, you will end up being at least 20-25 minutes late to your appointment with the provider. 2. Tardiness:  What happens if I arrive only a few minutes after my scheduled appointment time? You will need to reschedule your appointment. The cutoff is your appointment time. This is why it is so important that you arrive at least 30 minutes before that appointment. If you have an appointment scheduled for 10:00 AM and you arrive at 10:01, you will be required to reschedule your appointment.  3. Plan ahead:  Always assume that you will encounter traffic on your way in. Plan for it. If you are dependent on a driver, make sure they understand these rules and the need to arrive early. 4. Other appointments and responsibilities:  Avoid scheduling any other appointments before or after your pain clinic appointments.  5. Be prepared:  Write down everything that you need to discuss with your healthcare provider and give this information to the  admitting nurse. Write down the medications that you will need refilled. Bring your pills and bottles (even the empty ones), to all of your appointments, except for those where a procedure is scheduled. 6. No children or pets:  Find someone to take care of them. It is not appropriate to bring them in. 7. Scheduling changes:  We request "advanced notification" of any changes or cancellations. 8. Advanced notification:  Defined as a time period of more than 24 hours prior to the originally scheduled appointment. This allows for the appointment to be offered to other patients. 9. Rescheduling:  When a visit is rescheduled, it will require the cancellation of the original appointment. For this reason they both fall within the category of "Cancellations".  10. Cancellations:  They require advanced notification. Any cancellation less than 24 hours before the  appointment will be recorded as a "No Show". 11. No Show:  Defined as an unkept appointment where the patient failed to notify or declare to the practice their intention or inability to keep the appointment.  Corrective process for repeat offenders:  1. Tardiness: Three (3) episodes of rescheduling due to late arrivals will be recorded as one (1) "No Show". 2. Cancellation or reschedule: Three (3) cancellations or rescheduling will be recorded as one (1) "No Show". 3. "No Shows": Three (3) "No Shows" within a 12 month period will result in discharge from the practice. ____________________________________________________________________________________________  ____________________________________________________________________________________________  Pain Scale  Introduction: The pain score used by this practice is the Verbal Numerical Rating Scale (VNRS-11). This is an 11-point scale. It is for adults and children 10 years or older. There are significant differences in how the pain score is  reported, used, and applied. Forget everything you  learned in the past and learn this scoring system.  General Information: The scale should reflect your current level of pain. Unless you are specifically asked for the level of your worst pain, or your average pain. If you are asked for one of these two, then it should be understood that it is over the past 24 hours.  Basic Activities of Daily Living (ADL): Personal hygiene, dressing, eating, transferring, and using restroom.  Instructions: Most patients tend to report their level of pain as a combination of two factors, their physical pain and their psychosocial pain. This last one is also known as "suffering" and it is reflection of how physical pain affects you socially and psychologically. From now on, report them separately. From this point on, when asked to report your pain level, report only your physical pain. Use the following table for reference.  Pain Clinic Pain Levels (0-5/10)  Pain Level Score  Description  No Pain 0   Mild pain 1 Nagging, annoying, but does not interfere with basic activities of daily living (ADL). Patients are able to eat, bathe, get dressed, toileting (being able to get on and off the toilet and perform personal hygiene functions), transfer (move in and out of bed or a chair without assistance), and maintain continence (able to control bladder and bowel functions). Blood pressure and heart rate are unaffected. A normal heart rate for a healthy adult ranges from 60 to 100 bpm (beats per minute).   Mild to moderate pain 2 Noticeable and distracting. Impossible to hide from other people. More frequent flare-ups. Still possible to adapt and function close to normal. It can be very annoying and may have occasional stronger flare-ups. With discipline, patients may get used to it and adapt.   Moderate pain 3 Interferes significantly with activities of daily living (ADL). It becomes difficult to feed, bathe, get dressed, get on and off the toilet or to perform personal  hygiene functions. Difficult to get in and out of bed or a chair without assistance. Very distracting. With effort, it can be ignored when deeply involved in activities.   Moderately severe pain 4 Impossible to ignore for more than a few minutes. With effort, patients may still be able to manage work or participate in some social activities. Very difficult to concentrate. Signs of autonomic nervous system discharge are evident: dilated pupils (mydriasis); mild sweating (diaphoresis); sleep interference. Heart rate becomes elevated (>115 bpm). Diastolic blood pressure (lower number) rises above 100 mmHg. Patients find relief in laying down and not moving.   Severe pain 5 Intense and extremely unpleasant. Associated with frowning face and frequent crying. Pain overwhelms the senses.  Ability to do any activity or maintain social relationships becomes significantly limited. Conversation becomes difficult. Pacing back and forth is common, as getting into a comfortable position is nearly impossible. Pain wakes you up from deep sleep. Physical signs will be obvious: pupillary dilation; increased sweating; goosebumps; brisk reflexes; cold, clammy hands and feet; nausea, vomiting or dry heaves; loss of appetite; significant sleep disturbance with inability to fall asleep or to remain asleep. When persistent, significant weight loss is observed due to the complete loss of appetite and sleep deprivation.  Blood pressure and heart rate becomes significantly elevated. Caution: If elevated blood pressure triggers a pounding headache associated with blurred vision, then the patient should immediately seek attention at an urgent or emergency care unit, as these may be signs of an impending stroke.  Emergency Department Pain Levels (6-10/10)  Emergency Room Pain 6 Severely limiting. Requires emergency care and should not be seen or managed at an outpatient pain management facility. Communication becomes difficult and  requires great effort. Assistance to reach the emergency department may be required. Facial flushing and profuse sweating along with potentially dangerous increases in heart rate and blood pressure will be evident.   Distressing pain 7 Self-care is very difficult. Assistance is required to transport, or use restroom. Assistance to reach the emergency department will be required. Tasks requiring coordination, such as bathing and getting dressed become very difficult.   Disabling pain 8 Self-care is no longer possible. At this level, pain is disabling. The individual is unable to do even the most "basic" activities such as walking, eating, bathing, dressing, transferring to a bed, or toileting. Fine motor skills are lost. It is difficult to think clearly.   Incapacitating pain 9 Pain becomes incapacitating. Thought processing is no longer possible. Difficult to remember your own name. Control of movement and coordination are lost.   The worst pain imaginable 10 At this level, most patients pass out from pain. When this level is reached, collapse of the autonomic nervous system occurs, leading to a sudden drop in blood pressure and heart rate. This in turn results in a temporary and dramatic drop in blood flow to the brain, leading to a loss of consciousness. Fainting is one of the body's self defense mechanisms. Passing out puts the brain in a calmed state and causes it to shut down for a while, in order to begin the healing process.    Summary: 1. Refer to this scale when providing us with your pain level. 2. Be accurate and careful when reporting your pain level. This will help with your care. 3. Over-reporting your pain level will lead to loss of credibility. 4. Even a level of 1/10 means that there is pain and will be treated at our facility. 5. High, inaccurate reporting will be documented as "Symptom Exaggeration", leading to loss of credibility and suspicions of possible secondary gains such as  obtaining more narcotics, or wanting to appear disabled, for fraudulent reasons. 6. Only pain levels of 5 or below will be seen at our facility. 7. Pain levels of 6 and above will be sent to the Emergency Department and the appointment cancelled. ____________________________________________________________________________________________    BMI Assessment: Estimated body mass index is 28.34 kg/m as calculated from the following:   Height as of this encounter: 5\' 3"  (1.6 m).   Weight as of this encounter: 160 lb (72.6 kg).  BMI interpretation table: BMI level Category Range association with higher incidence of chronic pain  <18 kg/m2 Underweight   18.5-24.9 kg/m2 Ideal body weight   25-29.9 kg/m2 Overweight Increased incidence by 20%  30-34.9 kg/m2 Obese (Class I) Increased incidence by 68%  35-39.9 kg/m2 Severe obesity (Class II) Increased incidence by 136%  >40 kg/m2 Extreme obesity (Class III) Increased incidence by 254%   BMI Readings from Last 4 Encounters:  01/04/18 28.34 kg/m  12/19/17 28.87 kg/m  12/11/17 29.49 kg/m  12/06/17 29.29 kg/m   Wt Readings from Last 4 Encounters:  01/04/18 160 lb (72.6 kg)  12/19/17 163 lb (73.9 kg)  12/11/17 161 lb 4 oz (73.1 kg)  12/06/17 165 lb 5.5 oz (75 kg)

## 2018-01-04 NOTE — Telephone Encounter (Signed)
Copied from CRM [id number]. Topic: Inquiry >> Jan 04, 2018 11:26 AM Crist InfanteHarrald, Kathy J wrote: Reason for CRM: pt wants to know if Lauren will call in pain patch or pain cream for her knee. [id number] - [place], [place] - [address] [phone] (Phone) [phone] (Fax)

## 2018-01-04 NOTE — Progress Notes (Signed)
Safety precautions to be maintained throughout the outpatient stay will include: orient to surroundings, keep bed in low position, maintain call bell within reach at all times, provide assistance with transfer out of bed and ambulation.  

## 2018-01-04 NOTE — Progress Notes (Signed)
Patient's Name: Deanna Hartman  MRN: [id number]  Referring Provider: [name], Lauren M, FNP  DOB: 12/13/1955  PCP: [name], Lauren M, FNP  DOS: 01/04/2018  Note by: Crystal King NP  Service setting: Ambulatory outpatient  Specialty: Interventional Pain Management  Location: [facility] (AMB) Pain Management Facility    Patient type: New Patient    Primary Reason(s) for Visit: Initial Patient Evaluation CC: Knee Pain (right); Foot Pain (left); and Hip Pain (right)  HPI  Deanna Hartman is a 62 y.o. year old, female patient, who comes today for an initial evaluation. She has Hyperosmolar non-ketotic state in patient with type 2 diabetes mellitus (HCC); Hyperglycemia; DKA (diabetic ketoacidoses) (HCC); Osteoarthritis of right knee; Right hip pain; Type 2 diabetes mellitus with hypoglycemia without coma, with long-term current use of insulin (HCC); Hypothyroidism; Chronic pain of right knee (Primary Area of Pain); Chronic pain of right lower extremity (Secondary Area of Pain); Chronic pain in left foot (Tertiary Area of Pain); Chronic hip pain, right (Fourth Area of Pain); Chronic pain syndrome; Long term current use of opiate analgesic; Pharmacologic therapy; Disorder of skeletal system; and Problems influencing health status on their problem list.. Her primarily concern today is the Knee Pain (right); Foot Pain (left); and Hip Pain (right)  Pain Assessment: Location: Right Knee Radiating: up and down side of right leg; from ankle to hip Onset: More than a month ago Duration: Chronic pain Quality: Constant, Aching, Shooting Severity: 8 /10 (subjective, self-reported pain score)  Note: Reported level is compatible with observation. Clinically the patient looks like a 2/10 A 2/10 is viewed as "Mild to Moderate" and described as noticeable and distracting. Impossible to hide from other people. More frequent flare-ups. Still possible to adapt and function close to normal. It can be very annoying and may have occasional  stronger flare-ups. With discipline, patients may get used to it and adapt. Information on the proper use of the pain scale provided to the patient today. When using our objective Pain Scale, levels between 6 and 10/10 are said to belong in an emergency room, as it progressively worsens from a 6/10, described as severely limiting, requiring emergency care not usually available at an outpatient pain management facility. At a 6/10 level, communication becomes difficult and requires great effort. Assistance to reach the emergency department may be required. Facial flushing and profuse sweating along with potentially dangerous increases in heart rate and blood pressure will be evident. Effect on ADL: "keeps me from doing everything, every step is painful" Timing: Constant Modifying factors: percocet from dr in [place]; moved here 2 months ago BP: (!) 149/60  HR: 84  Onset and Duration: Gradual and Present longer than 3 months Cause of pain: Arthritis Severity: Getting worse, NAS-11 at its worse: 10/10, NAS-11 at its best: 5/10, NAS-11 now: 8/10 and NAS-11 on the average: 8/10 Timing: Not influenced by the time of the day Aggravating Factors: Climbing, Kneeling, Lifiting, Motion, Prolonged standing, Squatting, Stooping , Walking, Walking uphill and Walking downhill Alleviating Factors: Medications Associated Problems: Night-time cramps, Spasms, Swelling and Pain that wakes patient up Quality of Pain: Aching, Agonizing, Cramping, Disabling, Feeling of constriction, Horrible, Sharp, Shooting and Throbbing Previous Examinations or Tests: X-rays  The patient comes into the clinics today for the first time for a chronic pain management evaluation.  The patient's primary area of pain is in her right knee.  She admits that she has swelling and weakness.  Denies any previous injury.  She is status post left total knee replacement   approximately 6 years ago in [place].  She denies any recent physical therapy or  recent images.  Her second area of pain is in her right leg. The pain radiates up into her thigh and down into her ankle.  She feels like the pain is on the outer part of her leg.  She denies any numbness or tingling.  She admits that she has a shock like pain.  Third area of pain is her left foot.  She admits that she has had pain for greater than 1 year but is gotten worse in the last few months.  She caught her toe on her nightstand and now has pain in between her second and third toe.  She describes it as severe and admits that sometimes she is not able to weight-bear on this foot.  She denies any previous surgery, interventional therapy or physical therapy.  She admits that she did have some images in [place].  Her fourth area of pain is in her right hip.  She admits that she has had 3 prior right hip surgeries.  She admits that one was a revision secondary to a loose bone.  She admits that it was tied with wire and now on the wiring has broken legs.  She admits that this pain comes and goes.  She is to have surgery once she gets her diabetes under control..  Today I took the time to provide the patient with information regarding this pain practice. The patient was informed that the practice is divided into two sections: an interventional pain management section, as well as a completely separate and distinct medication management section. I explained that there are procedure days for interventional therapies, and evaluation days for follow-ups and medication management. Because of the amount of documentation required during both, they are kept separated. This means that there is the possibility that she may be scheduled for a procedure on one day, and medication management the next. I have also informed her that because of staffing and facility limitations, this practice will no longer take patients for medication management only. To illustrate the reasons for this, I gave the patient the example of  surgeons, and how inappropriate it would be to refer a patient to his/her care, just to write for the post-surgical antibiotics on a surgery done by a different surgeon.   Because interventional pain management is part of the board-certified specialty for the doctors, the patient was informed that joining this practice means that they are open to any and all interventional therapies. I made it clear that this does not mean that they will be forced to have any procedures done. What this means is that I believe interventional therapies to be essential part of the diagnosis and proper management of chronic pain conditions. Therefore, patients not interested in these interventional alternatives will be better served under the care of a different practitioner.  The patient was also made aware of my Comprehensive Pain Management Safety Guidelines where by joining this practice, they limit all of their nerve blocks and joint injections to those done by our practice, for as long as we are retained to manage their care. Historic Controlled Substance Pharmacotherapy Review  PMP and historical list of controlled substances: Limited PMP last 2 months only hydrocodone/acetaminophen 5/325, alprazolam 0.5 mg, tramadol 50 mg Tums, alprazolam 0.25 mg Highest opioid analgesic regimen found: Tramadol 50 mg 1 tablet 4 times daily (fill date 11/29/2017) tramadol 200 mg/day Most recent opioid analgesic: Hydrocodone/acetaminophen 5/325 mg 1   tablet 3 times daily (fill date 12/19/2017) hydrocodone 15 mg/day Current opioid analgesics: Hydrocodone/acetaminophen 5/325 mg 1 tablet 3 times daily (fill date 12/19/2017) hydrocodone 15 mg/day Highest recorded MME/day: 20 mg/day MME/day: 15 mg/day Medications: The patient did not bring the medication(s) to the appointment, as requested in our "New Patient Package" Pharmacodynamics: Desired effects: Analgesia: The patient reports >50% benefit. Reported improvement in function: The patient  reports medication allows her to accomplish basic ADLs. Clinically meaningful improvement in function (CMIF): Sustained CMIF goals met Perceived effectiveness: Described as relatively effective, allowing for increase in activities of daily living (ADL) Undesirable effects: Side-effects or Adverse reactions: None reported Historical Monitoring: The patient  reports that she does not use drugs. List of all UDS Test(s): Lab Results  Component Value Date   MDMA NONE DETECTED 12/04/2017   COCAINSCRNUR NONE DETECTED 12/04/2017   PCPSCRNUR NONE DETECTED 12/04/2017   THCU NONE DETECTED 12/04/2017   List of all Serum Drug Screening Test(s):  No results found for: AMPHSCRSER, BARBSCRSER, BENZOSCRSER, COCAINSCRSER, PCPSCRSER, PCPQUANT, THCSCRSER, CANNABQUANT, OPIATESCRSER, OXYSCRSER, PROPOXSCRSER Historical Background Evaluation: [place] PDMP: Six (6) year initial data search conducted.             [place] Department of public safety, offender search: (Public Information) Non-contributory Risk Assessment Profile: Aberrant behavior: None observed or detected today Risk factors for fatal opioid overdose: Benzodiazepine use, family history of addiction and history of substance abuse Fatal overdose hazard ratio (HR): Calculation deferred Non-fatal overdose hazard ratio (HR): Calculation deferred Risk of opioid abuse or dependence: 0.7-3.0% with doses ? 36 MME/day and 6.1-26% with doses ? 120 MME/day. Substance use disorder (SUD) risk level: Pending results of Medical Psychology Evaluation for SUD Opioid risk tool (ORT) (Total Score): 0  ORT Scoring interpretation table:  Score <3 = Low Risk for SUD  Score between 4-7 = Moderate Risk for SUD  Score >8 = High Risk for Opioid Abuse   PHQ-2 Depression Scale:  Total score: 0  PHQ-2 Scoring interpretation table: (Score and probability of major depressive disorder)  Score 0 = No depression  Score 1 = 15.4% Probability  Score 2 = 21.1% Probability  Score 3 =  38.4% Probability  Score 4 = 45.5% Probability  Score 5 = 56.4% Probability  Score 6 = 78.6% Probability   PHQ-9 Depression Scale:  Total score: 0  PHQ-9 Scoring interpretation table:  Score 0-4 = No depression  Score 5-9 = Mild depression  Score 10-14 = Moderate depression  Score 15-19 = Moderately severe depression  Score 20-27 = Severe depression (2.4 times higher risk of SUD and 2.89 times higher risk of overuse)   Pharmacologic Plan: Pending ordered tests and/or consults  Meds  The patient has a current medication list which includes the following prescription(s): alprazolam, blood glucose meter kit and supplies, furosemide, gabapentin, glucose blood, hydrocodone-acetaminophen, insulin aspart, basaglar kwikpen, levothyroxine, metformin, and pramipexole.  Current Outpatient Medications on File Prior to Visit  Medication Sig  . ALPRAZolam (XANAX) 0.25 MG tablet Take 0.25 mg by mouth at bedtime.   . blood glucose meter kit and supplies Dispense based on patient and insurance preference. Use up to four times daily as directed. (FOR ICD-10 E10.9, E11.9). Please dispense Freestyle glucose meter, test strips, and lancets  . furosemide (LASIX) 40 MG tablet Take 1 tablet (40 mg total) by mouth daily as needed.  . GABAPENTIN PO Take 1-2 tablets by mouth 2 (two) times daily. Take 1 tablet by mouth in the morning and 2 at bedtime.  .   glucose blood (GLUCOSE METER TEST) test strip Check blood sugar three times daily. Dispense test strips for [employer] 50 advance meter  . HYDROcodone-acetaminophen (NORCO/VICODIN) 5-325 MG tablet Take 1 tablet by mouth every 6 (six) hours as needed for moderate pain.  Marland Kitchen insulin aspart (NOVOLOG) 100 UNIT/ML injection Inject into the skin 3 (three) times daily with meals.  . Insulin Glargine (BASAGLAR KWIKPEN) 100 UNIT/ML SOPN Inject 0.15 mLs (15 Units total) into the skin at bedtime.  Marland Kitchen levothyroxine (SYNTHROID, LEVOTHROID) 175 MCG tablet Take 175 mcg by mouth  daily.   . metFORMIN (GLUCOPHAGE) 500 MG tablet Start metformin 535m by mouth once daily at dinner for 1 week, then increase to 5065mby mouth two times daily with breakfast and dinner.  . pramipexole (MIRAPEX) 0.75 MG tablet Take 1-2 tablets by mouth 2 (two) times daily. Take 1 tablet by mouth in the morning and 2 at bedtime.    No current facility-administered medications on file prior to visit.    Imaging Review  Hip Imaging: Hip-R DG 2-3 views:  Results for orders placed during the hospital encounter of 12/19/17  DG HIP UNILAT WITH PELVIS 2-3 VIEWS RIGHT   Narrative CLINICAL DATA:  Chronic right hip pain  EXAM: DG HIP (WITH OR WITHOUT PELVIS) 2-3V RIGHT  COMPARISON:  None.  FINDINGS: The right acetabular and femoral components of the right total hip replacement appear to be in good position. However there are several cerclage wires around the proximal right femur-trochanteric region which are broken, and there is appears to be a chronic avulsion of the greater trochanter. Several of the fragmented wires have short segments of the wire that are within the adjacent soft tissues. Left total hip replacement components are in good position with no complicating features noted. Pelvic rami are intact. The bones are osteopenic. Degenerative changes noted at L4-5.  IMPRESSION: 1. No acute fracture or dislocation. 2. Several broken cerclage wires around the right hip with several fragments of these wires within the adjacent soft tissues. 3. The left hip replacement components are in good position with no abnormality.   Electronically Signed   By: PaIvar Drape.D.   On: 12/19/2017 13:49   Note: Available results from prior imaging studies were reviewed.        ROS  Cardiovascular History: Chest pain Pulmonary or Respiratory History: No reported pulmonary signs or symptoms such as wheezing and difficulty taking a deep full breath (Asthma), difficulty blowing air out (Emphysema),  coughing up mucus (Bronchitis), persistent dry cough, or temporary stoppage of breathing during sleep Neurological History: No reported neurological signs or symptoms such as seizures, abnormal skin sensations, urinary and/or fecal incontinence, being born with an abnormal open spine and/or a tethered spinal cord Review of Past Neurological Studies: No results found for this or any previous visit. Psychological-Psychiatric History: No reported psychological or psychiatric signs or symptoms such as difficulty sleeping, anxiety, depression, delusions or hallucinations (schizophrenial), mood swings (bipolar disorders) or suicidal ideations or attempts Gastrointestinal History: No reported gastrointestinal signs or symptoms such as vomiting or evacuating blood, reflux, heartburn, alternating episodes of diarrhea and constipation, inflamed or scarred liver, or pancreas or irrregular and/or infrequent bowel movements Genitourinary History: No reported renal or genitourinary signs or symptoms such as difficulty voiding or producing urine, peeing blood, non-functioning kidney, kidney stones, difficulty emptying the bladder, difficulty controlling the flow of urine, or chronic kidney disease Hematological History: No reported hematological signs or symptoms such as prolonged bleeding, low or poor functioning  platelets, bruising or bleeding easily, hereditary bleeding problems, low energy levels due to low hemoglobin or being anemic Endocrine History: DIABETES AND THYROID DISEASE Rheumatologic History: Joint aches and or swelling due to excess weight (Osteoarthritis) Musculoskeletal History: Negative for myasthenia gravis, muscular dystrophy, multiple sclerosis or malignant hyperthermia Work History: Disabled  Allergies  Ms. Grout has No Known Allergies.  Laboratory Chemistry  Inflammation Markers No results found for: CRP, ESRSEDRATE (CRP: Acute Phase) (ESR: Chronic Phase) Renal Function Markers Lab  Results  Component Value Date   BUN 20 12/19/2017   CREATININE 0.75 12/19/2017   GFRAA >60 12/05/2017   GFRNONAA >60 12/05/2017   Hepatic Function Markers Lab Results  Component Value Date   AST 28 12/19/2017   ALT 28 12/19/2017   ALBUMIN 4.0 12/19/2017   ALKPHOS 163 (H) 12/19/2017   Electrolytes Lab Results  Component Value Date   NA 132 (L) 12/19/2017   K 5.5 (H) 12/19/2017   CL 98 12/19/2017   CALCIUM 9.7 12/19/2017   Neuropathy Markers No results found for: [id number] Bone Pathology Markers Lab Results  Component Value Date   ALKPHOS 163 (H) 12/19/2017   CALCIUM 9.7 12/19/2017   Coagulation Parameters Lab Results  Component Value Date   PLT 422.0 (H) 12/27/2017   Cardiovascular Markers Lab Results  Component Value Date   HGB 11.4 (L) 12/27/2017   HCT 35.6 (L) 12/27/2017   Note: Lab results reviewed.  [place]  Drug: Ms. Heidler  reports that she does not use drugs. Alcohol:  reports that she drank alcohol. Tobacco:  reports that she has quit smoking. She has never used smokeless tobacco. Medical:  has a past medical history of Arthritis, Diabetes mellitus without complication ([place]), and Hypothyroidism. Family: family history is not on file. She was adopted.  Past Surgical History:  Procedure Laterality Date  . TOTAL HIP ARTHROPLASTY Bilateral   . TOTAL KNEE ARTHROPLASTY Left    Active Ambulatory Problems    Diagnosis Date Noted  . Hyperosmolar non-ketotic state in patient with type 2 diabetes mellitus ([place]) 11/28/2017  . Hyperglycemia 11/29/2017  . DKA (diabetic ketoacidoses) ([place]) 12/03/2017  . Osteoarthritis of right knee 12/19/2017  . Right hip pain 12/19/2017  . Type 2 diabetes mellitus with hypoglycemia without coma, with long-term current use of insulin ([place]) 12/19/2017  . Hypothyroidism 12/19/2017  . Chronic pain of right knee (Primary Area of Pain) 01/04/2018  . Chronic pain of right lower extremity (Secondary Area of Pain) 01/04/2018  .  Chronic pain in left foot [facility] Area of Pain) 01/04/2018  . Chronic hip pain, right (Fourth Area of Pain) 01/04/2018  . Chronic pain syndrome 01/04/2018  . Long term current use of opiate analgesic 01/04/2018  . Pharmacologic therapy 01/04/2018  . Disorder of skeletal system 01/04/2018  . Problems influencing health status 01/04/2018   Resolved Ambulatory Problems    Diagnosis Date Noted  . No Resolved Ambulatory Problems   Past Medical History:  Diagnosis Date  . Arthritis   . Diabetes mellitus without complication ([place])    Constitutional Exam  General appearance: alert, cooperative and in mild distresswith dental caries Vitals:   01/04/18 0900  BP: (!) 149/60  Pulse: 84  Resp: 18  Temp: 98.2 F (36.8 C)  TempSrc: Oral  SpO2: 99%  Weight: 160 lb (72.6 kg)  Height: 5' 3" (1.6 m)   BMI Assessment: Estimated body mass index is 28.34 kg/m as calculated from the following:   Height as of this encounter: 5' 3" (1.6  m).   Weight as of this encounter: 160 lb (72.6 kg).  BMI interpretation table: BMI level Category Range association with higher incidence of chronic pain  <18 kg/m2 Underweight   18.5-24.9 kg/m2 Ideal body weight   25-29.9 kg/m2 Overweight Increased incidence by 20%  30-34.9 kg/m2 Obese (Class I) Increased incidence by 68%  35-39.9 kg/m2 Severe obesity (Class II) Increased incidence by 136%  >40 kg/m2 Extreme obesity (Class III) Increased incidence by 254%   BMI Readings from Last 4 Encounters:  01/04/18 28.34 kg/m  12/19/17 28.87 kg/m  12/11/17 29.49 kg/m  12/06/17 29.29 kg/m   Wt Readings from Last 4 Encounters:  01/04/18 160 lb (72.6 kg)  12/19/17 163 lb (73.9 kg)  12/11/17 161 lb 4 oz (73.1 kg)  12/06/17 165 lb 5.5 oz (75 kg)  Psych/Mental status: Alert, oriented x 3 (person, place, & time)       Eyes: PERLA Respiratory: No evidence of acute respiratory distress  Cervical Spine Exam  Inspection: No masses, redness, or  swelling Alignment: Symmetrical Functional ROM: Unrestricted ROM      Stability: No instability detected Muscle strength & Tone: Functionally intact Sensory: Unimpaired Palpation: No palpable anomalies              Upper Extremity (UE) Exam    Side: Right upper extremity  Side: Left upper extremity  Inspection: No masses, redness, swelling, or asymmetry. No contractures  Inspection: No masses, redness, swelling, or asymmetry. No contractures  Functional ROM: Unrestricted ROM          Functional ROM: Unrestricted ROM          Muscle strength & Tone: Functionally intact  Muscle strength & Tone: Functionally intact  Sensory: Unimpaired  Sensory: Unimpaired  Palpation: No palpable anomalies              Palpation: No palpable anomalies              Specialized Test(s): Deferred         Specialized Test(s): Deferred          Thoracic Spine Exam  Inspection: No masses, redness, or swelling Alignment: Symmetrical Functional ROM: Unrestricted ROM Stability: No instability detected Sensory: Unimpaired Muscle strength & Tone: No palpable anomalies  Lumbar Spine Exam  Inspection: No masses, redness, or swelling Alignment: Symmetrical Functional ROM: Unrestricted ROM      Stability: No instability detected Muscle strength & Tone: Functionally intact Sensory: Unimpaired Palpation: No palpable anomalies       Provocative Tests: Lumbar Hyperextension and rotation test: evaluation deferred today       Patrick's Maneuver: evaluation deferred today                    Gait & Posture Assessment  Ambulation: Patient ambulates using a cane Gait: Limited. Using assistive device to ambulate Posture: Antalgic   Lower Extremity Exam    Side: Right lower extremity  Side: Left lower extremity  Inspection: Edema  Inspection: No masses, redness, swelling, or asymmetry. No contracturesWell-healed scar from previous surgery slight deformity of second and third phalanges  Functional ROM: Diminished ROM  for hip and knee joints  Functional ROM: Adequate ROM          Muscle strength & Tone: Normal strength (5/5)  Muscle strength & Tone: Functionally intact  Sensory: Referred pain pattern  Sensory: Unimpaired  Palpation: Complains of area being tender to palpation  Palpation: Non-tender   Assessment  Primary Diagnosis & Pertinent Problem List:   The primary encounter diagnosis was Chronic pain of right knee (Primary Area of Pain). Diagnoses of Chronic pain of right lower extremity (Secondary Area of Pain), Chronic pain in left foot (Tertiary Area of Pain), Chronic hip pain, right (Fourth Area of Pain), Chronic pain syndrome, Long term current use of opiate analgesic, Pharmacologic therapy, Disorder of skeletal system, and Problems influencing health status were also pertinent to this visit.  Visit Diagnosis: 1. Chronic pain of right knee (Primary Area of Pain)   2. Chronic pain of right lower extremity (Secondary Area of Pain)   3. Chronic pain in left foot [facility] Area of Pain)   4. Chronic hip pain, right (Fourth Area of Pain)   5. Chronic pain syndrome   6. Long term current use of opiate analgesic   7. Pharmacologic therapy   8. Disorder of skeletal system   9. Problems influencing health status    Plan of Care  Initial treatment plan:  Please be advised that as per protocol, today's visit has been an evaluation only. We have not taken over the patient's controlled substance management.  Problem-specific plan: No problem-specific Assessment & Plan notes found for this encounter.  Ordered Lab-work, Procedure(s), Referral(s), & Consult(s): Orders Placed This Encounter  Procedures  . DG Knee 1-2 Views Right  . DG Foot Complete Left  . Compliance Drug Analysis, Ur  . Comp. Metabolic Panel (12)  . Magnesium  . Vitamin B12  . Sedimentation rate  . 25-Hydroxyvitamin D Lcms D2+D3  . C-reactive protein   Pharmacotherapy: Medications ordered:  No orders of the defined types were  placed in this encounter.  Medications administered during this visit: Smera Guyette had no medications administered during this visit.   Pharmacotherapy under consideration:  Opioid Analgesics: The patient was informed that there is no guarantee that she would be a candidate for opioid analgesics. The decision will be made following CDC guidelines. This decision will be based on the results of diagnostic studies, as well as Ms. Salemi's risk profile.  Membrane stabilizer: To be determined at a later time Muscle relaxant: To be determined at a later time NSAID: To be determined at a later time Other analgesic(s): To be determined at a later time   Interventional therapies under consideration: Ms. Shanks was informed that there is no guarantee that she would be a candidate for interventional therapies. The decision will be based on the results of diagnostic studies, as well as Ms. Easom's risk profile.  Possible procedure(s): Diagnostic right intra-articular knee injection Diagnostic right knee Hyalgan series Diagnostic right femoral and obturator nerve block   Provider-requested follow-up: Return for 2nd Visit, w/ Dr. Dossie Arbour, medical record release.  No future appointments.  Primary Care Physician: Jodelle Green, FNP Location: [facility] Facility Note by:  Date: 01/04/2018; Time: 2:35 PM  Pain Score Disclaimer: We use the NRS-11 scale. This is a self-reported, subjective measurement of pain severity with only modest accuracy. It is used primarily to identify changes within a particular patient. It must be understood that outpatient pain scales are significantly less accurate that those used for research, where they can be applied under ideal controlled circumstances with minimal exposure to variables. In reality, the score is likely to be a combination of pain intensity and pain affect, where pain affect describes the degree of emotional arousal or changes in action  readiness caused by the sensory experience of pain. Factors such as social and work situation, setting, emotional state, anxiety levels, expectation, and  prior pain experience may influence pain perception and show large inter-individual differences that may also be affected by time variables.  Patient instructions provided during this appointment: Patient Instructions   You will have x rays completed prior to next appt. with pain clinic. ____________________________________________________________________________________________  Appointment Policy Summary  It is our goal and responsibility to provide the medical community with assistance in the evaluation and management of patients with chronic pain. Unfortunately our resources are limited. Because we do not have an unlimited amount of time, or available appointments, we are required to closely monitor and manage their use. The following rules exist to maximize their use:  Patient's responsibilities: 1. Punctuality:  At what time should I arrive? You should be physically present in our office 30 minutes before your scheduled appointment. Your scheduled appointment is with your assigned healthcare provider. However, it takes 5-10 minutes to be "checked-in", and another 15 minutes for the nurses to do the admission. If you arrive to our office at the time you were given for your appointment, you will end up being at least 20-25 minutes late to your appointment with the provider. 2. Tardiness:  What happens if I arrive only a few minutes after my scheduled appointment time? You will need to reschedule your appointment. The cutoff is your appointment time. This is why it is so important that you arrive at least 30 minutes before that appointment. If you have an appointment scheduled for 10:00 AM and you arrive at 10:01, you will be required to reschedule your appointment.  3. Plan ahead:  Always assume that you will encounter traffic on your way in.  Plan for it. If you are dependent on a driver, make sure they understand these rules and the need to arrive early. 4. Other appointments and responsibilities:  Avoid scheduling any other appointments before or after your pain clinic appointments.  5. Be prepared:  Write down everything that you need to discuss with your healthcare provider and give this information to the admitting nurse. Write down the medications that you will need refilled. Bring your pills and bottles (even the empty ones), to all of your appointments, except for those where a procedure is scheduled. 6. No children or pets:  Find someone to take care of them. It is not appropriate to bring them in. 7. Scheduling changes:  We request "advanced notification" of any changes or cancellations. 8. Advanced notification:  Defined as a time period of more than 24 hours prior to the originally scheduled appointment. This allows for the appointment to be offered to other patients. 9. Rescheduling:  When a visit is rescheduled, it will require the cancellation of the original appointment. For this reason they both fall within the category of "Cancellations".  10. Cancellations:  They require advanced notification. Any cancellation less than 24 hours before the  appointment will be recorded as a "No Show". 11. No Show:  Defined as an unkept appointment where the patient failed to notify or declare to the practice their intention or inability to keep the appointment.  Corrective process for repeat offenders:  1. Tardiness: Three (3) episodes of rescheduling due to late arrivals will be recorded as one (1) "No Show". 2. Cancellation or reschedule: Three (3) cancellations or rescheduling will be recorded as one (1) "No Show". 3. "No Shows": Three (3) "No Shows" within a 12 month period will result in discharge from the  practice. ____________________________________________________________________________________________  ____________________________________________________________________________________________  Pain Scale  Introduction: The pain score used by this practice   is the Verbal Numerical Rating Scale (VNRS-11). This is an 11-point scale. It is for adults and children 10 years or older. There are significant differences in how the pain score is reported, used, and applied. Forget everything you learned in the past and learn this scoring system.  General Information: The scale should reflect your current level of pain. Unless you are specifically asked for the level of your worst pain, or your average pain. If you are asked for one of these two, then it should be understood that it is over the past 24 hours.  Basic Activities of Daily Living (ADL): Personal hygiene, dressing, eating, transferring, and using restroom.  Instructions: Most patients tend to report their level of pain as a combination of two factors, their physical pain and their psychosocial pain. This last one is also known as "suffering" and it is reflection of how physical pain affects you socially and psychologically. From now on, report them separately. From this point on, when asked to report your pain level, report only your physical pain. Use the following table for reference.  Pain Clinic Pain Levels (0-5/10)  Pain Level Score  Description  No Pain 0   Mild pain 1 Nagging, annoying, but does not interfere with basic activities of daily living (ADL). Patients are able to eat, bathe, get dressed, toileting (being able to get on and off the toilet and perform personal hygiene functions), transfer (move in and out of bed or a chair without assistance), and maintain continence (able to control bladder and bowel functions). Blood pressure and heart rate are unaffected. A normal heart rate for a healthy adult ranges from 60 to 100 bpm  (beats per minute).   Mild to moderate pain 2 Noticeable and distracting. Impossible to hide from other people. More frequent flare-ups. Still possible to adapt and function close to normal. It can be very annoying and may have occasional stronger flare-ups. With discipline, patients may get used to it and adapt.   Moderate pain 3 Interferes significantly with activities of daily living (ADL). It becomes difficult to feed, bathe, get dressed, get on and off the toilet or to perform personal hygiene functions. Difficult to get in and out of bed or a chair without assistance. Very distracting. With effort, it can be ignored when deeply involved in activities.   Moderately severe pain 4 Impossible to ignore for more than a few minutes. With effort, patients may still be able to manage work or participate in some social activities. Very difficult to concentrate. Signs of autonomic nervous system discharge are evident: dilated pupils (mydriasis); mild sweating (diaphoresis); sleep interference. Heart rate becomes elevated (>115 bpm). Diastolic blood pressure (lower number) rises above 100 mmHg. Patients find relief in laying down and not moving.   Severe pain 5 Intense and extremely unpleasant. Associated with frowning face and frequent crying. Pain overwhelms the senses.  Ability to do any activity or maintain social relationships becomes significantly limited. Conversation becomes difficult. Pacing back and forth is common, as getting into a comfortable position is nearly impossible. Pain wakes you up from deep sleep. Physical signs will be obvious: pupillary dilation; increased sweating; goosebumps; brisk reflexes; cold, clammy hands and feet; nausea, vomiting or dry heaves; loss of appetite; significant sleep disturbance with inability to fall asleep or to remain asleep. When persistent, significant weight loss is observed due to the complete loss of appetite and sleep deprivation.  Blood pressure and heart  rate becomes significantly elevated. Caution: If elevated blood pressure   triggers a pounding headache associated with blurred vision, then the patient should immediately seek attention at an urgent or emergency care unit, as these may be signs of an impending stroke.    Emergency Department Pain Levels (6-10/10)  Emergency Room Pain 6 Severely limiting. Requires emergency care and should not be seen or managed at an outpatient pain management facility. Communication becomes difficult and requires great effort. Assistance to reach the emergency department may be required. Facial flushing and profuse sweating along with potentially dangerous increases in heart rate and blood pressure will be evident.   Distressing pain 7 Self-care is very difficult. Assistance is required to transport, or use restroom. Assistance to reach the emergency department will be required. Tasks requiring coordination, such as bathing and getting dressed become very difficult.   Disabling pain 8 Self-care is no longer possible. At this level, pain is disabling. The individual is unable to do even the most "basic" activities such as walking, eating, bathing, dressing, transferring to a bed, or toileting. Fine motor skills are lost. It is difficult to think clearly.   Incapacitating pain 9 Pain becomes incapacitating. Thought processing is no longer possible. Difficult to remember your own name. Control of movement and coordination are lost.   The worst pain imaginable 10 At this level, most patients pass out from pain. When this level is reached, collapse of the autonomic nervous system occurs, leading to a sudden drop in blood pressure and heart rate. This in turn results in a temporary and dramatic drop in blood flow to the brain, leading to a loss of consciousness. Fainting is one of the body's self defense mechanisms. Passing out puts the brain in a calmed state and causes it to shut down for a while, in order to begin the  healing process.    Summary: 1. Refer to this scale when providing us with your pain level. 2. Be accurate and careful when reporting your pain level. This will help with your care. 3. Over-reporting your pain level will lead to loss of credibility. 4. Even a level of 1/10 means that there is pain and will be treated at our facility. 5. High, inaccurate reporting will be documented as "Symptom Exaggeration", leading to loss of credibility and suspicions of possible secondary gains such as obtaining more narcotics, or wanting to appear disabled, for fraudulent reasons. 6. Only pain levels of 5 or below will be seen at our facility. 7. Pain levels of 6 and above will be sent to the Emergency Department and the appointment cancelled. ____________________________________________________________________________________________    BMI Assessment: Estimated body mass index is 28.34 kg/m as calculated from the following:   Height as of this encounter: 5' 3" (1.6 m).   Weight as of this encounter: 160 lb (72.6 kg).  BMI interpretation table: BMI level Category Range association with higher incidence of chronic pain  <18 kg/m2 Underweight   18.5-24.9 kg/m2 Ideal body weight   25-29.9 kg/m2 Overweight Increased incidence by 20%  30-34.9 kg/m2 Obese (Class I) Increased incidence by 68%  35-39.9 kg/m2 Severe obesity (Class II) Increased incidence by 136%  >40 kg/m2 Extreme obesity (Class III) Increased incidence by 254%   BMI Readings from Last 4 Encounters:  01/04/18 28.34 kg/m  12/19/17 28.87 kg/m  12/11/17 29.49 kg/m  12/06/17 29.29 kg/m   Wt Readings from Last 4 Encounters:  01/04/18 160 lb (72.6 kg)  12/19/17 163 lb (73.9 kg)  12/11/17 161 lb 4 oz (73.1 kg)  12/06/17 165 lb 5.5 oz (  75 kg)   

## 2018-01-05 ENCOUNTER — Emergency Department
Admission: EM | Admit: 2018-01-05 | Discharge: 2018-01-05 | Disposition: A | Discharge status: Home / Self Care | Payer: Medicare Other | Attending: Emergency Medicine | Admitting: Emergency Medicine

## 2018-01-05 ENCOUNTER — Encounter: Payer: Self-pay | Admitting: Emergency Medicine

## 2018-01-05 ENCOUNTER — Emergency Department: Payer: Medicare Other

## 2018-01-05 DIAGNOSIS — R739 Hyperglycemia, unspecified: Secondary | ICD-10-CM

## 2018-01-05 DIAGNOSIS — M25561 Pain in right knee: Secondary | ICD-10-CM

## 2018-01-05 DIAGNOSIS — Z794 Long term (current) use of insulin: Secondary | ICD-10-CM | POA: Diagnosis not present

## 2018-01-05 DIAGNOSIS — G8929 Other chronic pain: Secondary | ICD-10-CM

## 2018-01-05 DIAGNOSIS — Z87891 Personal history of nicotine dependence: Secondary | ICD-10-CM | POA: Diagnosis not present

## 2018-01-05 DIAGNOSIS — E119 Type 2 diabetes mellitus without complications: Secondary | ICD-10-CM | POA: Insufficient documentation

## 2018-01-05 DIAGNOSIS — Z79899 Other long term (current) drug therapy: Secondary | ICD-10-CM | POA: Diagnosis not present

## 2018-01-05 DIAGNOSIS — M25551 Pain in right hip: Secondary | ICD-10-CM | POA: Insufficient documentation

## 2018-01-05 LAB — CBC WITH DIFFERENTIAL/PLATELET
BASOS ABS: 0 10*3/uL (ref 0–0.1)
Basophils Relative: 1 %
Eosinophils Absolute: 0.2 10*3/uL (ref 0–0.7)
Eosinophils Relative: 4 %
HEMATOCRIT: 37.4 % (ref 35.0–47.0)
HEMOGLOBIN: 12.4 g/dL (ref 12.0–16.0)
Lymphocytes Relative: 34 %
Lymphs Abs: 2 10*3/uL (ref 1.0–3.6)
MCH: 28.7 pg (ref 26.0–34.0)
MCHC: 33.2 g/dL (ref 32.0–36.0)
MCV: 86.3 fL (ref 80.0–100.0)
MONOS PCT: 6 %
Monocytes Absolute: 0.4 10*3/uL (ref 0.2–0.9)
NEUTROS ABS: 3.3 10*3/uL (ref 1.4–6.5)
NEUTROS PCT: 55 %
Platelets: 347 10*3/uL (ref 150–440)
RBC: 4.34 MIL/uL (ref 3.80–5.20)
RDW: 16.5 % — ABNORMAL HIGH (ref 11.5–14.5)
WBC: 5.9 10*3/uL (ref 3.6–11.0)

## 2018-01-05 LAB — BASIC METABOLIC PANEL
ANION GAP: 13 (ref 5–15)
BUN: 22 mg/dL (ref 8–23)
CO2: 23 mmol/L (ref 22–32)
Calcium: 9.6 mg/dL (ref 8.9–10.3)
Chloride: 99 mmol/L (ref 98–111)
Creatinine, Ser: 0.95 mg/dL (ref 0.44–1.00)
Glucose, Bld: 161 mg/dL — ABNORMAL HIGH (ref 70–99)
Potassium: 3.5 mmol/L (ref 3.5–5.1)
Sodium: 135 mmol/L (ref 135–145)

## 2018-01-05 LAB — GLUCOSE, CAPILLARY
Glucose-Capillary: 316 mg/dL — ABNORMAL HIGH (ref 70–99)
Glucose-Capillary: 75 mg/dL (ref 70–99)

## 2018-01-05 MED ORDER — INSULIN ASPART 100 UNIT/ML ~~LOC~~ SOLN
6.0000 [IU] | Freq: Once | SUBCUTANEOUS | Status: AC
  Administered 2018-01-05: 6 [IU] via INTRAVENOUS
  Filled 2018-01-05: qty 1
  Filled 2018-01-05: qty 0.06

## 2018-01-05 MED ORDER — SODIUM CHLORIDE 0.9 % IV BOLUS
1000.0000 mL | Freq: Once | INTRAVENOUS | Status: AC
  Administered 2018-01-05: 1000 mL via INTRAVENOUS

## 2018-01-05 MED ORDER — HYDROCODONE-ACETAMINOPHEN 5-325 MG PO TABS
1.0000 | ORAL_TABLET | Freq: Once | ORAL | Status: AC
  Administered 2018-01-05: 1 via ORAL
  Filled 2018-01-05: qty 1

## 2018-01-05 MED ORDER — HYDROCODONE-ACETAMINOPHEN 5-325 MG PO TABS: 1.0000 | ORAL_TABLET | Freq: Four times a day (QID) | ORAL | 0 refills | Status: AC | PRN

## 2018-01-05 NOTE — ED Triage Notes (Signed)
Pt arrives with concerns over her right hip. Pt states, "I feel like its dislocated - like the bone is going to come out of my skin." No shortening or rotation visible in triage. Pt states she has had 2 hip replacements prior. Pt was seen on 9/3 and had DG of hip but states the pain has been progressively intensifying.

## 2018-01-05 NOTE — Discharge Instructions (Addendum)
Your lab test today were okay.  We were able to get your blood sugar improved with IV fluids and a dose of IV insulin.  Your hip x-ray does not show any changes from before.  Keep your appointments as scheduled with orthopedics and pain management.

## 2018-01-05 NOTE — ED Notes (Signed)
Pt given juice and crackers.

## 2018-01-05 NOTE — ED Notes (Signed)
Patient c/o "not feeling right" and requested blood sugar be taken.  Patient AAOx3.  Skin warm and dry. NAD.  MAE equally and strong.

## 2018-01-05 NOTE — Telephone Encounter (Signed)
Pt calling back today to ask if Lauren would also refill her  HYDROcodone-acetaminophen (NORCO/VICODIN) 5-325 MG tablet  Pt went to pain management, but was told no meds until she sees the dr. Rock NephewPt states she only saw the nurse.  Pt states states that is not until October 2nd week.  [id number] - [place], [place] - [address]

## 2018-01-05 NOTE — ED Provider Notes (Signed)
[facility] Emergency Department Provider Note  ____________________________________________  Time seen: Approximately 4:40 PM  I have reviewed the triage vital signs and the nursing notes.   HISTORY  Chief Complaint Hip Pain    HPI Desaree Downen is a 62 y.o. female with a history of hypothyroidism and diabetes who comes to the ED complaining of right hip pain.  This pain is been ongoing for many weeks.  She has had 2 prior hip surgeries on the right hip.  No recent trauma.  No fevers or chills weakness or paresthesias.  She had previously been receiving opioid pain medication from her primary care doctor, but they have stopped prescribing it and referred her to pain management who she has seen for an initial evaluation.  Pain is moderate intensity, constant, worse with movement, no alleviating factors, nonradiating.  Also notes that her blood sugar was over 500 earlier today.  She took 20 units of NovoLog insulin subcutaneous as directed by her sliding scale.  In the ED, fingerstick was 322.  Patient does report polyuria and polydipsia over the past few days.      Past Medical History:  Diagnosis Date  . Arthritis   . Diabetes mellitus without complication ([place])   . Hypothyroidism      Patient Active Problem List   Diagnosis Date Noted  . Chronic pain of right knee (Primary Area of Pain) 01/04/2018  . Chronic pain of right lower extremity (Secondary Area of Pain) 01/04/2018  . Chronic pain in left foot [facility] Area of Pain) 01/04/2018  . Chronic hip pain, right (Fourth Area of Pain) 01/04/2018  . Chronic pain syndrome 01/04/2018  . Long term current use of opiate analgesic 01/04/2018  . Pharmacologic therapy 01/04/2018  . Disorder of skeletal system 01/04/2018  . Problems influencing health status 01/04/2018  . Osteoarthritis of right knee 12/19/2017  . Right hip pain 12/19/2017  . Type 2 diabetes mellitus with hypoglycemia without coma, with  long-term current use of insulin ([place]) 12/19/2017  . Hypothyroidism 12/19/2017  . DKA (diabetic ketoacidoses) ([place]) 12/03/2017  . Hyperglycemia 11/29/2017  . Hyperosmolar non-ketotic state in patient with type 2 diabetes mellitus ([place]) 11/28/2017     Past Surgical History:  Procedure Laterality Date  . TOTAL HIP ARTHROPLASTY Bilateral   . TOTAL KNEE ARTHROPLASTY Left      Prior to Admission medications   Medication Sig Start Date End Date Taking? Authorizing Provider  ALPRAZolam (XANAX) 0.25 MG tablet Take 0.25 mg by mouth at bedtime.     [provider]  blood glucose meter kit and supplies Dispense based on patient and insurance preference. Use up to four times daily as directed. (FOR ICD-10 E10.9, E11.9). Please dispense Freestyle glucose meter, test strips, and lancets 12/13/17   [name], Jacquelynn Cree, FNP  furosemide (LASIX) 40 MG tablet Take 1 tablet (40 mg total) by mouth daily as needed. 11/29/17   Gladstone Lighter, MD  GABAPENTIN PO Take 1-2 tablets by mouth 2 (two) times daily. Take 1 tablet by mouth in the morning and 2 at bedtime.    [provider]  glucose blood (GLUCOSE METER TEST) test strip Check blood sugar three times daily. Dispense test strips for [facility] 50 advance meter 12/12/17   [name], Jacquelynn Cree, FNP  HYDROcodone-acetaminophen (NORCO/VICODIN) 5-325 MG tablet Take 1 tablet by mouth every 6 (six) hours as needed for moderate pain. 01/05/18   [name], Jacquelynn Cree, FNP  insulin aspart (NOVOLOG) 100 UNIT/ML injection Inject into the  skin 3 (three) times daily with meals.    [provider]  Insulin Glargine (BASAGLAR KWIKPEN) 100 UNIT/ML SOPN Inject 0.15 mLs (15 Units total) into the skin at bedtime. 12/11/17   Jodelle Green, FNP  levothyroxine (SYNTHROID, LEVOTHROID) 175 MCG tablet Take 175 mcg by mouth daily.     [provider]  metFORMIN (GLUCOPHAGE) 500 MG tablet Start metformin 52m by mouth once daily at dinner for 1 week, then increase to  5035mby mouth two times daily with breakfast and dinner. 12/11/17   GuJodelle GreenFNP  pramipexole (MIRAPEX) 0.75 MG tablet Take 1-2 tablets by mouth 2 (two) times daily. Take 1 tablet by mouth in the morning and 2 at bedtime.     [provider]     Allergies Patient has no known allergies.   Family History  Adopted: Yes    Social History Social History   Tobacco Use  . Smoking status: Former SmResearch scientist (life sciences). Smokeless tobacco: Never Used  Substance Use Topics  . Alcohol use: Not Currently    Frequency: Never  . Drug use: Never    Review of Systems  Constitutional:   No fever or chills.  ENT:   No sore throat. No rhinorrhea. Cardiovascular:   No chest pain or syncope. Respiratory:   No dyspnea or cough. Gastrointestinal:   Negative for abdominal pain, vomiting and diarrhea.  Musculoskeletal:   Chronic right leg pain and left foot pain. All other systems reviewed and are negative except as documented above in ROS and HPI.  ____________________________________________   PHYSICAL EXAM:  VITAL SIGNS: ED Triage Vitals  Enc Vitals Group     BP 01/05/18 1300 130/77     Pulse Rate 01/05/18 1300 88     Resp 01/05/18 1300 16     Temp 01/05/18 1300 97.7 F (36.5 C)     Temp Source 01/05/18 1300 Oral     SpO2 01/05/18 1300 99 %     Weight 01/05/18 1301 160 lb (72.6 kg)     Height 01/05/18 1301 _0  (1.6 m)     Head Circumference --      Peak Flow --      Pain Score 01/05/18 1301 8     Pain Loc --      Pain Edu? --      Excl. in [place]--     Vital signs reviewed, nursing assessments reviewed.   Constitutional:   Alert and oriented. Non-toxic appearance. Eyes:   Conjunctivae are normal. EOMI. PERRL. ENT      Head:   Normocephalic and atraumatic.      Nose:   No congestion/rhinnorhea.       Mouth/Throat:   MMM, no pharyngeal erythema. No peritonsillar mass.       Neck:   No meningismus. Full ROM. Hematological/Lymphatic/Immunilogical:   No cervical  lymphadenopathy. Cardiovascular:   RRR. Symmetric bilateral radial and DP and PT pulses.  No murmurs. Cap refill less than 2 seconds. Respiratory:   Normal respiratory effort without tachypnea/retractions. Breath sounds are clear and equal bilaterally. No wheezes/rales/rhonchi. Gastrointestinal:   Soft and nontender. Non distended. There is no CVA tenderness.  No rebound, rigidity, or guarding.  Musculoskeletal:   Normal range of motion in all extremities.  No significant pain with passive range of motion.  No joint effusions.  No lower extremity tenderness.  No edema.  Compartments soft. Neurologic:   Normal speech and language.  Motor grossly intact. No acute focal  neurologic deficits are appreciated.  Skin:    Skin is warm, dry and intact. No rash noted.  No petechiae, purpura, or bullae.  ____________________________________________    LABS (pertinent positives/negatives) (all labs ordered are listed, but only abnormal results are displayed) Labs Reviewed  GLUCOSE, CAPILLARY - Abnormal; Notable for the following components:      Result Value   Glucose-Capillary 316 (*)    All other components within normal limits  BASIC METABOLIC PANEL - Abnormal; Notable for the following components:   Glucose, Bld 161 (*)    All other components within normal limits  CBC WITH DIFFERENTIAL/PLATELET - Abnormal; Notable for the following components:   RDW 16.5 (*)    All other components within normal limits  URINALYSIS, COMPLETE (UACMP) WITH MICROSCOPIC   ____________________________________________   EKG    ____________________________________________    RADIOLOGY  Dg Hip Unilat  With Pelvis 2-3 Views Right  Result Date: 01/05/2018 CLINICAL DATA:  Concern over RIGHT hip, concern for dislocation. EXAM: DG HIP (WITH OR WITHOUT PELVIS) 2-3V RIGHT COMPARISON:  12/19/2017 FINDINGS: There is no evidence of hip fracture or dislocation. There is no evidence of arthropathy or other focal bone  abnormality. 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 There several fractured cerclage wires, at least 1 of which is in the medial soft tissues, of the RIGHT thigh, unchanged from priors. No cerclage wires have been placed on the LEFT. IMPRESSION: No evidence of hip fracture, dislocation, or loosening. Several fractured cerclage wires are seen associated with the RIGHT THA. Electronically Signed   By: Staci Righter M.D.   On: 01/05/2018 13:41    ____________________________________________   PROCEDURES Procedures  ____________________________________________    CLINICAL IMPRESSION / ASSESSMENT AND PLAN / ED COURSE  Pertinent labs & imaging results that were available during my care of the patient were reviewed by me and considered in my medical decision making (see chart for details).    Patient presents with chronic pain without evidence of acute trauma.  X-ray reviewed which does show broken cerclage wires but otherwise no acute findings.  The broken wires are known to the patient and her primary care doctor and are unchanged compared to previous x-ray.  Patient has follow-up with orthopedics planned in the next 2 weeks.  No evidence of DVT or skin or soft tissue infection.  Doubt septic arthritis necrotizing fasciitis or osteomyelitis.  No evidence of fracture or dislocation or other acute hardware complication.  Because of the hyperglycemia and polyuria/polydipsia, I obtained labs which are unremarkable.  No evidence of acidosis.  Patient given IV fluids and a small dose of aspart insulin.    ----------------------------------------- 4:44 PM on 01/05/2018 -----------------------------------------  On recheck fingerstick was 48 but patient is asymptomatic.  We will have her eat, at which point I think she is suitable for discharge from the ED.  She will need to follow-up with her primary care doctor for further management of her diabetes which appears to be complicated  by labile blood sugar.      ____________________________________________   FINAL CLINICAL IMPRESSION(S) / ED DIAGNOSES    Final diagnoses:  Chronic pain of right hip  Hyperglycemia  Chronic pain of right knee     ED Discharge Orders    None      Portions of this note were generated with dragon dictation software. Dictation errors may occur despite best attempts at proofreading.    Carrie Mew, MD 01/05/18 [id number]

## 2018-01-05 NOTE — Telephone Encounter (Signed)
Called to inform patient that medication has been sent in. Was unable to leave a message.

## 2018-01-05 NOTE — Addendum Note (Signed)
Addended by: Leanora CoverGUSE, Yuriana Gaal on: 01/05/2018 12:24 PM   Modules accepted: Orders

## 2018-01-05 NOTE — Telephone Encounter (Signed)
10 tablet refill given of norco  PMP registry checked  OTC cream like a biofreeze menthol rub is a great option

## 2018-01-08 ENCOUNTER — Ambulatory Visit: Payer: Medicare Other | Attending: Pain Medicine | Admitting: Pain Medicine

## 2018-01-08 ENCOUNTER — Other Ambulatory Visit: Payer: Self-pay

## 2018-01-08 ENCOUNTER — Encounter: Payer: Self-pay | Admitting: Pain Medicine

## 2018-01-08 VITALS — BP 142/76 | HR 75 | Temp 98.3°F | Resp 16 | Ht 63.0 in | Wt 160.0 lb

## 2018-01-08 DIAGNOSIS — E039 Hypothyroidism, unspecified: Secondary | ICD-10-CM | POA: Insufficient documentation

## 2018-01-08 DIAGNOSIS — G8929 Other chronic pain: Secondary | ICD-10-CM | POA: Diagnosis not present

## 2018-01-08 DIAGNOSIS — Z794 Long term (current) use of insulin: Secondary | ICD-10-CM | POA: Diagnosis not present

## 2018-01-08 DIAGNOSIS — Z79899 Other long term (current) drug therapy: Secondary | ICD-10-CM | POA: Diagnosis not present

## 2018-01-08 DIAGNOSIS — Z87891 Personal history of nicotine dependence: Secondary | ICD-10-CM | POA: Insufficient documentation

## 2018-01-08 DIAGNOSIS — Z96643 Presence of artificial hip joint, bilateral: Secondary | ICD-10-CM | POA: Diagnosis not present

## 2018-01-08 DIAGNOSIS — M25561 Pain in right knee: Secondary | ICD-10-CM | POA: Insufficient documentation

## 2018-01-08 DIAGNOSIS — M79672 Pain in left foot: Secondary | ICD-10-CM | POA: Diagnosis not present

## 2018-01-08 DIAGNOSIS — E1100 Type 2 diabetes mellitus with hyperosmolarity without nonketotic hyperglycemic-hyperosmolar coma (NKHHC): Secondary | ICD-10-CM | POA: Diagnosis not present

## 2018-01-08 DIAGNOSIS — E11649 Type 2 diabetes mellitus with hypoglycemia without coma: Secondary | ICD-10-CM | POA: Diagnosis not present

## 2018-01-08 DIAGNOSIS — Z96652 Presence of left artificial knee joint: Secondary | ICD-10-CM | POA: Insufficient documentation

## 2018-01-08 DIAGNOSIS — M25551 Pain in right hip: Secondary | ICD-10-CM | POA: Diagnosis not present

## 2018-01-08 DIAGNOSIS — G894 Chronic pain syndrome: Secondary | ICD-10-CM | POA: Diagnosis not present

## 2018-01-08 DIAGNOSIS — M1711 Unilateral primary osteoarthritis, right knee: Secondary | ICD-10-CM

## 2018-01-08 DIAGNOSIS — Z79890 Hormone replacement therapy (postmenopausal): Secondary | ICD-10-CM | POA: Diagnosis not present

## 2018-01-08 DIAGNOSIS — M79604 Pain in right leg: Secondary | ICD-10-CM | POA: Diagnosis not present

## 2018-01-08 DIAGNOSIS — Z789 Other specified health status: Secondary | ICD-10-CM | POA: Diagnosis not present

## 2018-01-08 DIAGNOSIS — Z79891 Long term (current) use of opiate analgesic: Secondary | ICD-10-CM | POA: Diagnosis not present

## 2018-01-08 LAB — GLUCOSE, CAPILLARY: Glucose-Capillary: 48 mg/dL — ABNORMAL LOW (ref 70–99)

## 2018-01-08 MED ORDER — KETOROLAC TROMETHAMINE 60 MG/2ML IM SOLN
60.0000 mg | Freq: Once | INTRAMUSCULAR | Status: AC
  Administered 2018-01-08: 60 mg via INTRAMUSCULAR

## 2018-01-08 MED ORDER — ORPHENADRINE CITRATE 30 MG/ML IJ SOLN
60.0000 mg | Freq: Once | INTRAMUSCULAR | Status: AC
  Administered 2018-01-08: 60 mg via INTRAMUSCULAR

## 2018-01-08 MED ORDER — ROPIVACAINE HCL 2 MG/ML IJ SOLN
2.0000 mL | Freq: Once | INTRAMUSCULAR | Status: AC
  Administered 2018-01-08: 10 mL via INTRA_ARTICULAR
  Filled 2018-01-08: qty 10

## 2018-01-08 MED ORDER — ORPHENADRINE CITRATE 30 MG/ML IJ SOLN
INTRAMUSCULAR | Status: AC
  Filled 2018-01-08: qty 2

## 2018-01-08 MED ORDER — LIDOCAINE HCL (PF) 1 % IJ SOLN
5.0000 mL | Freq: Once | INTRAMUSCULAR | Status: AC
  Administered 2018-01-08: 5 mL
  Filled 2018-01-08: qty 5

## 2018-01-08 MED ORDER — METHYLPREDNISOLONE ACETATE 40 MG/ML IJ SUSP
40.0000 mg | Freq: Once | INTRAMUSCULAR | Status: AC
  Administered 2018-01-08: 40 mg via INTRA_ARTICULAR

## 2018-01-08 MED ORDER — KETOROLAC TROMETHAMINE 60 MG/2ML IM SOLN
INTRAMUSCULAR | Status: AC
  Filled 2018-01-08: qty 2

## 2018-01-08 MED ORDER — METHYLPREDNISOLONE ACETATE 80 MG/ML IJ SUSP
80.0000 mg | Freq: Once | INTRAMUSCULAR | Status: DC
  Filled 2018-01-08: qty 1

## 2018-01-08 NOTE — Patient Instructions (Addendum)
____________________________________________________________________________________________  Post-Procedure Discharge Instructions  Instructions:  Apply ice: Fill a plastic sandwich bag with crushed ice. Cover it with a small towel and apply to injection site. Apply for 15 minutes then remove x 15 minutes. Repeat sequence on day of procedure, until you go to bed. The purpose is to minimize swelling and discomfort after procedure.  Apply heat: Apply heat to procedure site starting the day following the procedure. The purpose is to treat any soreness and discomfort from the procedure.  Food intake: Start with clear liquids (like water) and advance to regular food, as tolerated.   Physical activities: Keep activities to a minimum for the first 8 hours after the procedure.   Driving: If you have received any sedation, you are not allowed to drive for 24 hours after your procedure.  Blood thinner: Restart your blood thinner 6 hours after your procedure. (Only for those taking blood thinners)  Insulin: As soon as you can eat, you may resume your normal dosing schedule. (Only for those taking insulin)  Infection prevention: Keep procedure site clean and dry.  Post-procedure Pain Diary: Extremely important that this be done correctly and accurately. Recorded information will be used to determine the next step in treatment.  Pain evaluated is that of treated area only. Do not include pain from an untreated area.  Complete every hour, on the hour, for the initial 8 hours. Set an alarm to help you do this part accurately.  Do not go to sleep and have it completed later. It will not be accurate.  Follow-up appointment: Keep your follow-up appointment after the procedure. Usually 2 weeks for most procedures. (6 weeks in the case of radiofrequency.) Bring you pain diary.   Expect:  From numbing medicine (AKA: Local Anesthetics): Numbness or decrease in pain.  Onset: Full effect within 15  minutes of injected.  Duration: It will depend on the type of local anesthetic used. On the average, 1 to 8 hours.   From steroids: Decrease in swelling or inflammation. Once inflammation is improved, relief of the pain will follow.  Onset of benefits: Depends on the amount of swelling present. The more swelling, the longer it will take for the benefits to be seen. In some cases, up to 10 days.  Duration: Steroids will stay in the system x 2 weeks. Duration of benefits will depend on multiple posibilities including persistent irritating factors.  Occasional side-effects: Facial flushing, cramps (if present, drink Gatorade and take over-the-counter Magnesium 450-500 mg once to twice a day).  From procedure: Some discomfort is to be expected once the numbing medicine wears off. This should be minimal if ice and heat are applied as instructed.  Call if:  You experience numbness and weakness that gets worse with time, as opposed to wearing off.  New onset bowel or bladder incontinence. (This applies to Spinal procedures only)  Emergency Numbers:  Durning business hours (Monday - Thursday, 8:00 AM - 4:00 PM) (Friday, 9:00 AM - 12:00 Noon): [phone]  After hours: [phone] ____________________________________________________________________________________________   ____________________________________________________________________________________________  General Risks and Possible Complications  Patient Responsibilities: It is important that you read this as it is part of your informed consent. It is our duty to inform you of the risks and possible complications associated with treatments offered to you. It is your responsibility as a patient to read this and to ask questions about anything that is not clear or that you believe was not covered in this document.  Patient's Rights: You  have the right to refuse treatment. You also have the right to change your mind, even after  initially having agreed to have the treatment done. However, under this last option, if you wait until the last second to change your mind, you may be charged for the materials used up to that point.  Introduction: Medicine is not an Visual merchandiser. Everything in Medicine, including the lack of treatment(s), carries the potential for danger, harm, or loss (which is by definition: Risk). In Medicine, a complication is a secondary problem, condition, or disease that can aggravate an already existing one. All treatments carry the risk of possible complications. The fact that a side effects or complications occurs, does not imply that the treatment was conducted incorrectly. It must be clearly understood that these can happen even when everything is done following the highest safety standards.  No treatment: You can choose not to proceed with the proposed treatment alternative. The "PRO(s)" would include: avoiding the risk of complications associated with the therapy. The "CON(s)" would include: not getting any of the treatment benefits. These benefits fall under one of three categories: diagnostic; therapeutic; and/or palliative. Diagnostic benefits include: getting information which can ultimately lead to improvement of the disease or symptom(s). Therapeutic benefits are those associated with the successful treatment of the disease. Finally, palliative benefits are those related to the decrease of the primary symptoms, without necessarily curing the condition (example: decreasing the pain from a flare-up of a chronic condition, such as incurable terminal cancer).  General Risks and Complications: These are associated to most interventional treatments. They can occur alone, or in combination. They fall under one of the following six (6) categories: no benefit or worsening of symptoms; bleeding; infection; nerve damage; allergic reactions; and/or death. 1. No benefits or worsening of symptoms: In Medicine there  are no guarantees, only probabilities. No healthcare provider can ever guarantee that a medical treatment will work, they can only state the probability that it may. Furthermore, there is always the possibility that the condition may worsen, either directly, or indirectly, as a consequence of the treatment. 2. Bleeding: This is more common if the patient is taking a blood thinner, either prescription or over the counter (example: Goody Powders, Fish oil, Aspirin, Garlic, etc.), or if suffering a condition associated with impaired coagulation (example: Hemophilia, cirrhosis of the liver, low platelet counts, etc.). However, even if you do not have one on these, it can still happen. If you have any of these conditions, or take one of these drugs, make sure to notify your treating physician. 3. Infection: This is more common in patients with a compromised immune system, either due to disease (example: diabetes, cancer, human immunodeficiency virus [HIV], etc.), or due to medications or treatments (example: therapies used to treat cancer and rheumatological diseases). However, even if you do not have one on these, it can still happen. If you have any of these conditions, or take one of these drugs, make sure to notify your treating physician. 4. Nerve Damage: This is more common when the treatment is an invasive one, but it can also happen with the use of medications, such as those used in the treatment of cancer. The damage can occur to small secondary nerves, or to large primary ones, such as those in the spinal cord and brain. This damage may be temporary or permanent and it may lead to impairments that can range from temporary numbness to permanent paralysis and/or brain death. 5. Allergic Reactions: Any time a  substance or material comes in contact with our body, there is the possibility of an allergic reaction. These can range from a mild skin rash (contact dermatitis) to a severe systemic reaction  (anaphylactic reaction), which can result in death. 6. Death: In general, any medical intervention can result in death, most of the time due to an unforeseen complication. ____________________________________________________________________________________________  ____________________________________________________________________________________________  Medication Rules  Applies to: All patients receiving prescriptions (written or electronic).  Pharmacy of record: Pharmacy where electronic prescriptions will be sent. If written prescriptions are taken to a different pharmacy, please inform the nursing staff. The pharmacy listed in the electronic medical record should be the one where you would like electronic prescriptions to be sent.  Prescription refills: Only during scheduled appointments. Applies to both, written and electronic prescriptions.  NOTE: The following applies primarily to controlled substances (Opioid* Pain Medications).   Patient's responsibilities: 1. Pain Pills: Bring all pain pills to every appointment (except for procedure appointments). 2. Pill Bottles: Bring pills in original pharmacy bottle. Always bring newest bottle. Bring bottle, even if empty. 3. Medication refills: You are responsible for knowing and keeping track of what medications you need refilled. The day before your appointment, write a list of all prescriptions that need to be refilled. Bring that list to your appointment and give it to the admitting nurse. Prescriptions will be written only during appointments. If you forget a medication, it will not be "Called in", "Faxed", or "electronically sent". You will need to get another appointment to get these prescribed. 4. Prescription Accuracy: You are responsible for carefully inspecting your prescriptions before leaving our office. Have the discharge nurse carefully go over each prescription with you, before taking them home. Make sure that your name is  accurately spelled, that your address is correct. Check the name and dose of your medication to make sure it is accurate. Check the number of pills, and the written instructions to make sure they are clear and accurate. Make sure that you are given enough medication to last until your next medication refill appointment. 5. Taking Medication: Take medication as prescribed. Never take more pills than instructed. Never take medication more frequently than prescribed. Taking less pills or less frequently is permitted and encouraged, when it comes to controlled substances (written prescriptions).  6. Inform other Doctors: Always inform, all of your healthcare providers, of all the medications you take. 7. Pain Medication from other Providers: You are not allowed to accept any additional pain medication from any other Doctor or Healthcare provider. There are two exceptions to this rule. (see below) In the event that you require additional pain medication, you are responsible for notifying us, as stated below. 8. Medication Agreement: You are responsible for carefully reading and following our Medication Agreement. This must be signed before receiving any prescriptions from our practice. Safely store a copy of your signed Agreement. Violations to the Agreement will result in no further prescriptions. (Additional copies of our Medication Agreement are available upon request.) 9. Laws, Rules, & Regulations: All patients are expected to follow all [address] and [employer], Rules, [place] [employer]. Ignorance of the Laws does not constitute a valid excuse. The use of any illegal substances is prohibited. 10. Adopted CDC guidelines & recommendations: Target dosing levels will be at or below 60 MME/day. Use of benzodiazepines** is not recommended.  Exceptions: There are only two exceptions to the rule of not receiving pain medications from other Healthcare Providers. 1. Exception #1 (Emergencies): In the event of  an  emergency (i.e.: accident requiring emergency care), you are allowed to receive additional pain medication. However, you are responsible for: As soon as you are able, call our office [phone], at any time of the day or night, and leave a message stating your name, the date and nature of the emergency, and the name and dose of the medication prescribed. In the event that your call is answered by a member of our staff, make sure to document and save the date, time, and the name of the person that took your information.  2. Exception #2 (Planned Surgery): In the event that you are scheduled by another doctor or dentist to have any type of surgery or procedure, you are allowed (for a period no longer than 30 days), to receive additional pain medication, for the acute post-op pain. However, in this case, you are responsible for picking up a copy of our "Post-op Pain Management for Surgeons" handout, and giving it to your surgeon or dentist. This document is available at our office, and does not require an appointment to obtain it. Simply go to our office during business hours (Monday-Thursday from 8:00 AM to 4:00 PM) (Friday 8:00 AM to 12:00 Noon) or if you have a scheduled appointment with [place], prior to your surgery, and ask for it by name. In addition, you will need to provide [place] with your name, name of your surgeon, type of surgery, and date of procedure or surgery.  *Opioid medications include: morphine, codeine, oxycodone, oxymorphone, hydrocodone, hydromorphone, meperidine, tramadol, tapentadol, buprenorphine, fentanyl, methadone. **Benzodiazepine medications include: diazepam (Valium), alprazolam (Xanax), clonazepam (Klonopine), lorazepam (Ativan), clorazepate (Tranxene), chlordiazepoxide (Librium), estazolam (Prosom), oxazepam (Serax), temazepam (Restoril), triazolam (Halcion) (Last updated:  06/15/2017) ____________________________________________________________________________________________   ____________________________________________________________________________________________  Medication Recommendations and Reminders  Applies to: All patients receiving prescriptions (written and/or electronic).  Medication Rules & Regulations: These rules and regulations exist for your safety and that of others. They are not flexible and neither are we. Dismissing or ignoring them will be considered "non-compliance" with medication therapy, resulting in complete and irreversible termination of such therapy. (See document titled "Medication Rules" for more details.) In all conscience, because of safety reasons, we cannot continue providing a therapy where the patient does not follow instructions.  Pharmacy of record:   Definition: This is the pharmacy where your electronic prescriptions will be sent.   We do not endorse any particular pharmacy.  You are not restricted in your choice of pharmacy.  The pharmacy listed in the electronic medical record should be the one where you want electronic prescriptions to be sent.  If you choose to change pharmacy, simply notify our nursing staff of your choice of new pharmacy.  Recommendations:  Keep all of your pain medications in a safe place, under lock and key, even if you live alone.   After you fill your prescription, take 1 week's worth of pills and put them away in a safe place. You should keep a separate, properly labeled bottle for this purpose. The remainder should be kept in the original bottle. Use this as your primary supply, until it runs out. Once it's gone, then you know that you have 1 week's worth of medicine, and it is time to come in for a prescription refill. If you do this correctly, it is unlikely that you will ever run out of medicine.  To make sure that the above recommendation works, it is very important that you  make sure your medication refill appointments are scheduled  at least 1 week before you run out of medicine. To do this in an effective manner, make sure that you do not leave the office without scheduling your next medication management appointment. Always ask the nursing staff to show you in your prescription , when your medication will be running out. Then arrange for the receptionist to get you a return appointment, at least 7 days before you run out of medicine. Do not wait until you have 1 or 2 pills left, to come in. This is very poor planning and does not take into consideration that we may need to cancel appointments due to bad weather, sickness, or emergencies affecting our staff.  "Partial Fill": If for any reason your pharmacy does not have enough pills/tablets to completely fill or refill your prescription, do not allow for a "partial fill". You will need a separate prescription to fill the remaining amount, which we will not provide. If the reason for the partial fill is your insurance, you will need to talk to the pharmacist about payment alternatives for the remaining tablets, but again, do not accept a partial fill.  Prescription refills and/or changes in medication(s):   Prescription refills, and/or changes in dose or medication, will be conducted only during scheduled medication management appointments. (Applies to both, written and electronic prescriptions.)  No refills on procedure days. No medication will be changed or started on procedure days. No changes, adjustments, and/or refills will be conducted on a procedure day. Doing so will interfere with the diagnostic portion of the procedure.  No phone refills. No medications will be "called into the pharmacy".  No Fax refills.  No weekend refills.  No Holliday refills.  No after hours refills.  Remember:  Business hours are:  Monday to Thursday 8:00 AM to 4:00 PM Provider's Schedule: Thad Ranger, NP - Appointments are:   Medication management: Monday to Thursday 8:00 AM to 4:00 PM Delano Metz, MD - Appointments are:  Medication management: Monday and Wednesday 8:00 AM to 4:00 PM Procedure day: Tuesday and Thursday 7:30 AM to 4:00 PM Edward Jolly, MD - Appointments are:  Medication management: Tuesday and Thursday 8:00 AM to 4:00 PM Procedure day: Monday and Wednesday 7:30 AM to 4:00 PM (Last update: 06/15/2017) ____________________________________________________________________________________________   ____________________________________________________________________________________________  CANNABIDIOL (AKA: CBD Oil or Pills)  Applies to: All patients receiving prescriptions of controlled substances (written and/or electronic).  General Information: Cannabidiol (CBD) was discovered in [birth year]. It is one of some 113 identified cannabinoids in cannabis (Marijuana) plants, accounting for up to 40% of the plant's extract. As of 2018, preliminary clinical research on cannabidiol included studies of anxiety, cognition, movement disorders, and pain.  Cannabidiol is consummed in multiple ways, including inhalation of cannabis smoke or vapor, as an aerosol spray into the cheek, and by mouth. It may be supplied as CBD oil containing CBD as the active ingredient (no added tetrahydrocannabinol (THC) or terpenes), a full-plant CBD-dominant hemp extract oil, capsules, dried cannabis, or as a liquid solution. CBD is thought not have the same psychoactivity as THC, and may affect the actions of THC. Studies suggest that CBD may interact with different biological targets, including cannabinoid receptors and other neurotransmitter receptors. As of 2018 the mechanism of action for its biological effects has not been determined.  In the [place], cannabidiol has a limited approval by the Food and Drug Administration (FDA) for treatment of only two types of epilepsy disorders. The side effects of long-term use of the  drug include somnolence,  decreased appetite, diarrhea, fatigue, malaise, weakness, sleeping problems, and others.  CBD remains a Schedule I drug prohibited for any use.  Legality: Some manufacturers ship CBD products nationally, an illegal action which the FDA has not enforced in 2018, with CBD remaining the subject of an FDA investigational new drug evaluation, and is not considered legal as a dietary supplement or food ingredient as of December 2018. Federal illegality has made it difficult historically to conduct research on CBD. CBD is openly sold in head shops and health food stores in some states where such sales have not been explicitly legalized.  Warning: Because it is not FDA approved for general use or treatment of pain, it is not required to undergo the same manufacturing controls as prescription drugs.  This means that the available cannabidiol (CBD) may be contaminated with THC.  If this is the case, it will trigger a positive urine drug screen (UDS) test for cannabinoids (Marijuana).  Because a positive UDS for illicit substances is a violation of our medication agreement, your opioid analgesics (pain medicine) may be permanently discontinued. (Last update: 07/06/2017) ____________________________________________________________________________________________   hyalgan Sodium Hyaluronate intra-articular injection What is this medicine? SODIUM HYALURONATE (SOE dee um hye al yoor ON ate) is used to treat pain in the knee due to osteoarthritis. This medicine may be used for other purposes; ask your health care provider or pharmacist if you have questions. COMMON BRAND NAME(S): Amvisc, DUROLANE, Euflexxa, GELSYN-3, Hyalgan, Monovisc, Orthovisc, Supartz, Supartz FX What should I tell my health care provider before I take this medicine? They need to know if you have any of these conditions: -bleeding disorders -glaucoma -infection in the knee joint -skin conditions or  sensitivity -skin infection -an unusual allergic reaction to sodium hyaluronate, other medicines, foods, dyes, or preservatives. Different brands of sodium hyaluronate contain different allergens. Some may contain egg. Talk to your doctor about your allergies to make sure that you get the right product. -pregnant or trying to get pregnant -breast-feeding How should I use this medicine? This medicine is for injection into the knee joint. It is given by a health care professional in a hospital or clinic setting. Talk to your pediatrician regarding the use of this medicine in children. Special care may be needed. Overdosage: If you think you have taken too much of this medicine contact a poison control center or emergency room at once. NOTE: This medicine is only for you. Do not share this medicine with others. What if I miss a dose? This does not apply. What may interact with this medicine? Interactions are not expected. This list may not describe all possible interactions. Give your health care provider a list of all the medicines, herbs, non-prescription drugs, or dietary supplements you use. Also tell them if you smoke, drink alcohol, or use illegal drugs. Some items may interact with your medicine. What should I watch for while using this medicine? Tell your doctor or healthcare professional if your symptoms do not start to get better or if they get worse. If receiving this medicine for osteoarthritis, limit your activity after you receive your injection. Avoid physical activity for 48 hours following your injection to keep your knee from swelling. Do not stand on your feet for more than 1 hour at a time during the first 48 hours following your injection. Ask your doctor or healthcare professional about when you can begin major physical activity again. What side effects may I notice from receiving this medicine? Side effects that you should report  to your doctor or health care professional as  soon as possible: -allergic reactions like skin rash, itching or hives, swelling of the face, lips, or tongue -dizziness -facial flushing -pain, tingling, numbness in the hands or feet -vision changes if received this medicine during eye surgery Side effects that usually do not require medical attention (report to your doctor or health care professional if they continue or are bothersome): -back pain -bruising at site where injected -chills -diarrhea -fever -headache -joint pain -joint stiffness -joint swelling -muscle cramps -muscle pain -nausea, vomiting -pain, redness, or irritation at site where injected -weak or tired This list may not describe all possible side effects. Call your doctor for medical advice about side effects. You may report side effects to FDA at 1-800-FDA-1088. Where should I keep my medicine? This drug is given in a hospital or clinic and will not be stored at home. NOTE: This sheet is a summary. It may not cover all possible information. If you have questions about this medicine, talk to your doctor, pharmacist, or health care provider.  2018 [employer] Standard (2015-05-07 08:34:51) Celiac Plexus Block Patient Information  Description: The celiac plexus is a group of nerves which are part of the sympathetic nervous system.  These nerves supply organs in the abdomen and pelvis.  Specific organs supplied with sensation by the celiac plexus include the stomach, liver, gallbladder, pancreas, kidneys and part of the gut.   The celiac plexus is located on both sides of the aorta at approximately the level of the first lumbar vertebral body.  The block will be performed with you lying on your abdomen with a pillow underneath.  Using direct x-ray guidance, the celiac plexus will be located on both sides of the spine.  Numbing medicine will be used to deaden the skin prior to needle insertion.  In most cases, a small amount of sedation can be given by IV prior to  the numbing medicine.  Two small needles will be place near the celiac plexus and local anesthetic and steroid will be injected.  The entire block usually last about 15-25 minutes.  Conditions which may be treated by celiac plexus block:   Acute and chronic pancreatitis  Pain from liver or pancreatic cancer  Pain from Crohn's disease  Other types of abdominal or flank pain  Preparation for the injection:  1. Do not eat any solid food or dairy products within 8 hours of your appointment. 2. You may drink clear liquids up to 3 hours before appointment.  Clear liquids include water, black coffee, juice or soda.  No milk or cream please. 3. You may take your regular medication, including pain medications, with a sip of water before your appointment.  Diabetics should hold regular insulin (if taken separately) and take 1/2 normal NPH dose in the morning of the procedure.  Carry some sugar containing items with you to your appointment. 4. A driver must accompany you and be prepared to drive you home after your procedure. 5. Bring all your current medications with you. 6. An IV may be inserted and sedation may be given at the discretion of the physician. 7. A blood pressure cuff, EKG, and other monitors will often be applied during the procedure.  Some patients may need to have extra oxygen administered for a short period. 8. You will be asked to provide medical information, including your allergies and medications, prior to the procedure.  We must know immediately if you are taking blood thinners (like Coumadin/Warfarin) or  if you are allergic to IV iodine contrast (dye).  We must know if you could possible be pregnant.  Possible side-effects:   Bleeding from needle site or deeper  Infection (rarre, can require surgery)  Nerve injury (rare)  Numbness & tingling (temporary)  Collapsed lung (rare)  Spinal headache ( a headache worse with upright posture)  Light-headedness  (temporary)  Pain at injection site (several days)  Decreased blood pressure (temporary)  Weakness in legs (temporary)  Seizure or other drug reaction (rare)  Call if you experience:   Fever/chills associated with headache or increased back/neck pain  Headache worsened by an upright position  New onset weakness or numbness of an extremity below the injection site.  Hives or difficulty breathing (go to the emergency room)  Inflammation or drainage at the injection site.  New onset diarrhea lasting more than 2 weeks.  New symptoms which are concerning to you  Please note:  If effective, we will often do a series of 2-3 injections spaced 3-6 weeks apart to maximally decrease your pain.  If initial series is effective, you may be a candidate for a more permanent block of the celiac plexus. .  If you have questions, please call [phone] [facility]

## 2018-01-08 NOTE — Progress Notes (Signed)
Results were reviewed and found to be: abnormal  Further testing may be useful  Review would suggest interventional pain management techniques may be of benefit

## 2018-01-08 NOTE — Progress Notes (Signed)
Safety precautions to be maintained throughout the outpatient stay will include: orient to surroundings, keep bed in low position, maintain call bell within reach at all times, provide assistance with transfer out of bed and ambulation.  

## 2018-01-08 NOTE — Progress Notes (Signed)
Results were reviewed and found to be: abnormal  No acute injury or pathology identified  

## 2018-01-08 NOTE — Progress Notes (Signed)
Patient's Name: Deanna Hartman  MRN: [id number]  Referring Provider: Jodelle Green, FNP  DOB: 1955-06-02  PCP: Deanna Green, FNP  DOS: 01/08/2018  Note by: Deanna Cola, MD  Service setting: Ambulatory outpatient  Specialty: Interventional Pain Management  Location: [facility] (AMB) Pain Management Facility    Patient type: Established patient (2nd Visit) + (Procedure)   Procedure:          Anesthesia, Analgesia, Anxiolysis:  Type: Diagnostic Intra-Articular Local anesthetic and steroid Knee Injection #1  Region: Lateral infrapatellar Knee Region Level: Knee Joint Laterality: Right knee  Type: Local Anesthesia Indication(s): Analgesia         Local Anesthetic: Lidocaine 1-2% Route: Infiltration ([place]/IM) IV Access: Declined Sedation: Declined    Indications: 1. Chronic knee pain (Primary Area of Pain) (Right)   2. Osteoarthritis of knee (Right)   3. Chronic lower extremity pain (Secondary Area of Pain) (Right)    Pain Score: Pre-procedure: 7 /10 Post-procedure: 0-No pain/10  Primary Reason(s) for Visit: Encounter for evaluation before starting new chronic pain management plan of care (Level of risk: moderate) CC: Knee Pain (right); Hip Pain (right); and Foot Pain (left)  HPI  Deanna Hartman is a 62 y.o. year old, female patient, who comes today for a follow-up evaluation to review the test results and decide on a treatment plan. She has Hyperosmolar non-ketotic state in patient with type 2 diabetes mellitus ([place]); Hyperglycemia; DKA (diabetic ketoacidoses) ([place]); Osteoarthritis of knee (Right); Type 2 diabetes mellitus with hypoglycemia without coma, with long-term current use of insulin ([place]); Hypothyroidism; Chronic knee pain (Primary Area of Pain) (Right); Chronic lower extremity pain (Secondary Area of Pain) (Right); Chronic foot pain (Tertiary Area of Pain) (Left); Chronic hip pain (Fourth Area of Pain) (Right); Chronic pain syndrome; Long term current use of opiate analgesic;  Pharmacologic therapy; Disorder of skeletal system; and Problems influencing health status on their problem list. Her primarily concern today is the Knee Pain (right); Hip Pain (right); and Foot Pain (left)  Pain Assessment: Location: Right Hip Radiating: right leg, left foot pain also Onset: More than a month ago Duration: Chronic pain Quality: Throbbing, Constant Severity: 7 /10 (subjective, self-reported pain score)  Note: Reported level is inconsistent with clinical observations. Clinically the patient looks like a 4/10 A 4/10 is viewed as "Moderately Severe" and described as impossible to ignore for more than a few minutes. With effort, patients may still be able to manage work or participate in some social activities. Very difficult to concentrate. Signs of autonomic nervous system discharge are evident: dilated pupils (mydriasis); mild sweating (diaphoresis); sleep interference. Heart rate becomes elevated (>115 bpm). Diastolic blood pressure (lower number) rises above 100 mmHg. Patients find relief in laying down and not moving. Information on the proper use of the pain scale provided to the patient today. When using our objective Pain Scale, levels between 6 and 10/10 are said to belong in an emergency room, as it progressively worsens from a 6/10, described as severely limiting, requiring emergency care not usually available at an outpatient pain management facility. At a 6/10 level, communication becomes difficult and requires great effort. Assistance to reach the emergency department may be required. Facial flushing and profuse sweating along with potentially dangerous increases in heart rate and blood pressure will be evident. Timing: Constant Modifying factors: percocet BP: (!) 142/76  HR: 75  Deanna Hartman comes in today for a follow-up visit after her initial evaluation on 01/04/2018. Today we went over the results of  her tests. These were explained in "Layman's terms". During today's  appointment we went over my diagnostic impression, as well as the proposed treatment plan.  The patient's primary area of pain is in her right knee.  She admits that she has swelling and weakness.  Denies any previous injury.  She is S/P left total knee replacement approximately 6 years ago in [place].  She denies any recent physical therapy or recent images.  Her second area of pain is in her right leg. The pain radiates up into her thigh and down into her ankle.  She feels like the pain is on the outer part of her leg.  She denies any numbness or tingling.  She admits that she has a shock like pain.  Third area of pain is her left foot.  She admits that she has had pain for greater than 1 year but is gotten worse in the last few months.  She caught her toe on her nightstand and now has pain in between her second and third toe.  She describes it as severe and admits that sometimes she is not able to weight-bear on this foot.  She denies any previous surgery, interventional therapy or physical therapy.  She admits that she did have some images in [place].  Her fourth area of pain is in her right hip.  She admits that she has had 3 prior right hip surgeries.  She admits that one was a revision secondary to a loose bone.  She admits that it was tied with wire and now on the wiring has broken legs.  She admits that this pain comes and goes.  She is to have surgery once she gets her diabetes under control. She is scheduled to see Deanna Hartman, but has not done that yet. Moved from [place], 2 months ago. Trying to get established here.   In considering the treatment plan options, Deanna Hartman was reminded that I no longer take patients for medication management only. I asked her to let me know if she had no intention of taking advantage of the interventional therapies, so that we could make arrangements to provide this space to someone interested. I also made it clear that undergoing  interventional therapies for the purpose of getting pain medications is very inappropriate on the part of a patient, and it will not be tolerated in this practice. This type of behavior would suggest true addiction and therefore it requires referral to an addiction specialist.   Further details on both, my assessment(s), as well as the proposed treatment plan, please see below.  Controlled Substance Pharmacotherapy Assessment REMS (Risk Evaluation and Mitigation Strategy)  Analgesic: Hydrocodone/acetaminophen 5/325 mg 1 tablet 3 times daily (fill date 12/19/2017) hydrocodone 15 mg/day Highest recorded MME/day: 20 mg/day MME/day: 15 mg/day Pill Count: None expected due to no prior prescriptions written by our practice. Hart Rochester, RN  01/08/2018 10:51 AM  Sign at close encounter Safety precautions to be maintained throughout the outpatient stay will include: orient to surroundings, keep bed in low position, maintain call bell within reach at all times, provide assistance with transfer out of bed and ambulation.    Pharmacokinetics: Liberation and absorption (onset of action): WNL Distribution (time to peak effect): WNL Metabolism and excretion (duration of action): WNL         Pharmacodynamics: Desired effects: Analgesia: Ms. Gebbia reports >50% benefit. Functional ability: Patient reports that medication allows her to accomplish basic ADLs Clinically meaningful improvement in function (CMIF):  Sustained CMIF goals met Perceived effectiveness: Described as relatively effective, allowing for increase in activities of daily living (ADL) Undesirable effects: Side-effects or Adverse reactions: None reported Monitoring: [place] PMP: Online review of the past 6-monthperiod previously conducted. Not applicable at this point since we have not taken over the patient's medication management yet. List of other Serum/Urine Drug Screening Test(s):  Lab Results  Component Value Date   COCAINSCRNUR  NONE DETECTED 12/04/2017   THCU NONE DETECTED 12/04/2017   List of all UDS test(s) done:  No results found. Last UDS on record: No results found. UDS interpretation: Not applicable.          Medication Assessment Form: Not applicable. Treatment compliance: Not applicable Risk Assessment Profile: Aberrant behavior: See initial evaluations. None observed or detected today Comorbid factors increasing risk of overdose: See initial evaluation. No additional risks detected today Opioid risk tool (ORT) (Total Score): 0 ORT Risk Level calculation: Opioid Risk Interpretation: Low Risk Risk of substance use disorder (SUD): Low Opioid Risk Tool - 01/08/18 1053      History of Preadolescent Sexual Abuse   History of Preadolescent Sexual Abuse  Negative or Female      Total Score   Opioid Risk Tool Scoring  0    Opioid Risk Interpretation  Low Risk      ORT Scoring interpretation table:  Score <3 = Low Risk for SUD  Score between 4-7 = Moderate Risk for SUD  Score >8 = High Risk for Opioid Abuse   Risk Mitigation Strategies:  Patient opioid safety counseling: No controlled substances prescribed. Patient-Prescriber Agreement (PPA): No agreement signed.  Controlled substance notification to other providers: None required. No opioid therapy.  Pharmacologic Plan: No opioid analgesic prescribed.             Laboratory Chemistry  Inflammation Markers (CRP: Acute Phase) (ESR: Chronic Phase) Lab Results  Component Value Date   CRP 2 01/04/2018   ESRSEDRATE 40 01/04/2018                         Renal Function Markers Lab Results  Component Value Date   BUN 22 01/05/2018   CREATININE 0.95 01/05/2018   BCR 19 01/04/2018   GFRAA >60 01/05/2018   GFRNONAA >60 01/05/2018                             Hepatic Function Markers Lab Results  Component Value Date   AST 33 01/04/2018   ALT 28 12/19/2017   ALBUMIN 4.4 01/04/2018   ALKPHOS 247 (H) 01/04/2018   LIPASE 39 11/28/2017                         Electrolytes Lab Results  Component Value Date   NA 135 01/05/2018   K 3.5 01/05/2018   CL 99 01/05/2018   CALCIUM 9.6 01/05/2018   MG 2.0 01/04/2018                        Neuropathy Markers Lab Results  Component Value Date   VITAMINB12 327 01/04/2018   HGBA1C 9.5 (H) 11/28/2017   HIV Non Reactive 11/28/2017                        Bone Pathology Markers Lab Results  Component Value Date   25OHVITD1 WILL FOLLOW 01/04/2018  25OHVITD2 WILL FOLLOW 01/04/2018   25OHVITD3 WILL FOLLOW 01/04/2018                         Coagulation Parameters Lab Results  Component Value Date   PLT 347 01/05/2018                        Cardiovascular Markers Lab Results  Component Value Date   TROPONINI <0.03 12/03/2017   HGB 12.4 01/05/2018   HCT 37.4 01/05/2018                         Note: Lab results reviewed.  Recent Diagnostic Imaging Review  Hip Imaging: Hip-R DG 2-3 views:  Results for orders placed during the hospital encounter of 01/05/18  DG Hip Unilat  With Pelvis 2-3 Views Right   Narrative CLINICAL DATA:  Concern over RIGHT hip, concern for dislocation.  EXAM: DG HIP (WITH OR WITHOUT PELVIS) 2-3V RIGHT  COMPARISON:  12/19/2017  FINDINGS: There is no evidence of hip fracture or dislocation. There is no evidence of arthropathy or other focal bone abnormality. 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  There several fractured cerclage wires, at least 1 of which is in the medial soft tissues, of the RIGHT thigh, unchanged from priors. No cerclage wires have been placed on the LEFT.  IMPRESSION: No evidence of hip fracture, dislocation, or loosening. Several fractured cerclage wires are seen associated with the RIGHT THA.  Electronically Signed   By: Staci Righter M.D.   On: 01/05/2018 13:41    Knee Imaging: Knee-R DG 1-2 views:  Results for orders placed during the hospital encounter of 01/04/18  DG Knee 1-2 Views Right    Narrative CLINICAL DATA:  Chronic RIGHT knee pain.  No injury.  EXAM: RIGHT KNEE - 1-2 VIEW  COMPARISON:  None.  FINDINGS: Tricompartmental degenerative change. Osteopenia. Small effusion. No fracture.  IMPRESSION: Chronic changes as described.   Electronically Signed   By: Staci Righter M.D.   On: 01/04/2018 15:24    Foot Imaging: Foot-L DG Complete:  Results for orders placed during the hospital encounter of 01/04/18  DG Foot Complete Left   Narrative CLINICAL DATA:  Chronic knee and foot pain.  No injury.  EXAM: LEFT FOOT - COMPLETE 3+ VIEW  COMPARISON:  No recent prior.  FINDINGS: Diffuse osteopenia and degenerative change. Erosive changes appear to be present at the second and third tarsometatarsal joints. This could be secondary to an infectious or inflammatory arthropathy. No evidence of fracture or dislocation. No radiopaque foreign body.  IMPRESSION: Erosive changes appear to be present at the second and third tarsometatarsal joints. This could be secondary to an infectious or inflammatory arthropathy.  2.  Diffuse osteopenia.  No evidence of fracture dislocation.   Electronically Signed   By: Marcello Moores  Register   On: 01/04/2018 15:25    Complexity Note: Imaging results reviewed. Results shared with Ms. Rowley, using [employer].                         Meds   Current Outpatient Medications:  .  ALPRAZolam (XANAX) 0.25 MG tablet, Take 0.25 mg by mouth at bedtime. , Disp: , Rfl:  .  blood glucose meter kit and supplies, Dispense based on patient and insurance preference. Use up to four times daily as directed. (FOR ICD-10 E10.9, E11.9). Please dispense  Freestyle glucose meter, test strips, and lancets, Disp: 1 each, Rfl: 0 .  furosemide (LASIX) 40 MG tablet, Take 1 tablet (40 mg total) by mouth daily as needed., Disp: 30 tablet, Rfl: 0 .  gabapentin (NEURONTIN) 300 MG capsule, Take 600 mg by mouth 3 (three) times daily., Disp: , Rfl:  .  glucose  blood (GLUCOSE METER TEST) test strip, Check blood sugar three times daily. Dispense test strips for [employer] 50 advance meter, Disp: 200 each, Rfl: 12 .  HYDROcodone-acetaminophen (NORCO/VICODIN) 5-325 MG tablet, Take 1 tablet by mouth every 6 (six) hours as needed for moderate pain., Disp: 10 tablet, Rfl: 0 .  insulin aspart (NOVOLOG) 100 UNIT/ML injection, Inject into the skin 3 (three) times daily with meals., Disp: , Rfl:  .  Insulin Glargine (BASAGLAR KWIKPEN) 100 UNIT/ML SOPN, Inject 0.15 mLs (15 Units total) into the skin at bedtime., Disp: 15 mL, Rfl: 1 .  levothyroxine (SYNTHROID, LEVOTHROID) 175 MCG tablet, Take 175 mcg by mouth daily. , Disp: , Rfl:  .  metFORMIN (GLUCOPHAGE) 500 MG tablet, Start metformin 57m by mouth once daily at dinner for 1 week, then increase to 5022mby mouth two times daily with breakfast and dinner., Disp: 180 tablet, Rfl: 3 .  pramipexole (MIRAPEX) 0.75 MG tablet, Take 1-2 tablets by mouth 2 (two) times daily. Take 1 tablet by mouth in the morning and 2 at bedtime. , Disp: , Rfl:   ROS  Constitutional: Denies any fever or chills Gastrointestinal: No reported hemesis, hematochezia, vomiting, or acute GI distress Musculoskeletal: Denies any acute onset joint swelling, redness, loss of ROM, or weakness Neurological: No reported episodes of acute onset apraxia, aphasia, dysarthria, agnosia, amnesia, paralysis, loss of coordination, or loss of consciousness  Allergies  Ms. DaBurkardas No Known Allergies.  [place]: Ms. DaPetzreports that she does not use drugs. Alcohol:  reports that she drank alcohol. Tobacco:  reports that she has quit smoking. She has never used smokeless tobacco. Medical:  has a past medical history of Arthritis, Diabetes mellitus without complication [place] and Hypothyroidism. Surgical: Ms. DaStarkeshas a past surgical history that includes Total hip arthroplasty (Bilateral) and Total knee arthroplasty (Left). Family: family history is  not on file. She was adopted.  Constitutional Exam  General appearance: Well nourished, well developed, and well hydrated. In no apparent acute distress Vitals:   01/08/18 1047 01/08/18 1327  BP: 130/61 (!) 142/76  Pulse: 81 75  Resp: 18 16  Temp: 98.3 F (36.8 C)   TempSrc: Oral   SpO2: 98% 99%  Weight: 160 lb (72.6 kg)   Height: _0  (1.6 m)    BMI Assessment: Estimated body mass index is 28.34 kg/m as calculated from the following:   Height as of this encounter: _1  (1.6 m).   Weight as of this encounter: 160 lb (72.6 kg).  BMI interpretation table: BMI level Category Range association with higher incidence of chronic pain  <18 kg/m2 Underweight   18.5-24.9 kg/m2 Ideal body weight   25-29.9 kg/m2 Overweight Increased incidence by 20%  30-34.9 kg/m2 Obese (Class I) Increased incidence by 68%  35-39.9 kg/m2 Severe obesity (Class II) Increased incidence by 136%  >40 kg/m2 Extreme obesity (Class III) Increased incidence by 254%   Patient's current BMI Ideal Body weight  Body mass index is 28.34 kg/m. Ideal body weight: 52.4 kg (115 lb 8.3 oz) Adjusted ideal body weight: 60.5 kg (133 lb 5 oz)   BMI Readings  from Last 4 Encounters:  01/08/18 28.34 kg/m  01/05/18 28.34 kg/m  01/04/18 28.34 kg/m  12/19/17 28.87 kg/m   Wt Readings from Last 4 Encounters:  01/08/18 160 lb (72.6 kg)  01/05/18 160 lb (72.6 kg)  01/04/18 160 lb (72.6 kg)  12/19/17 163 lb (73.9 kg)  Psych/Mental status: Alert, oriented x 3 (person, place, & time)       Eyes: PERLA Respiratory: No evidence of acute respiratory distress  Cervical Spine Area Exam  Skin & Axial Inspection: No masses, redness, edema, swelling, or associated skin lesions Alignment: Symmetrical Functional ROM: Unrestricted ROM      Stability: No instability detected Muscle Tone/Strength: Functionally intact. No obvious neuro-muscular anomalies detected. Sensory (Neurological): Unimpaired Palpation: No palpable anomalies               Upper Extremity (UE) Exam    Side: Right upper extremity  Side: Left upper extremity  Skin & Extremity Inspection: Skin color, temperature, and hair growth are WNL. No peripheral edema or cyanosis. No masses, redness, swelling, asymmetry, or associated skin lesions. No contractures.  Skin & Extremity Inspection: Skin color, temperature, and hair growth are WNL. No peripheral edema or cyanosis. No masses, redness, swelling, asymmetry, or associated skin lesions. No contractures.  Functional ROM: Unrestricted ROM          Functional ROM: Unrestricted ROM          Muscle Tone/Strength: Functionally intact. No obvious neuro-muscular anomalies detected.  Muscle Tone/Strength: Functionally intact. No obvious neuro-muscular anomalies detected.  Sensory (Neurological): Unimpaired          Sensory (Neurological): Unimpaired          Palpation: No palpable anomalies              Palpation: No palpable anomalies              Provocative Test(s):  Phalen's test: deferred Tinel's test: deferred Apley's scratch test (touch opposite shoulder):  Action 1 (Across chest): deferred Action 2 (Overhead): deferred Action 3 (LB reach): deferred   Provocative Test(s):  Phalen's test: deferred Tinel's test: deferred Apley's scratch test (touch opposite shoulder):  Action 1 (Across chest): deferred Action 2 (Overhead): deferred Action 3 (LB reach): deferred    Thoracic Spine Area Exam  Skin & Axial Inspection: No masses, redness, or swelling Alignment: Symmetrical Functional ROM: Unrestricted ROM Stability: No instability detected Muscle Tone/Strength: Functionally intact. No obvious neuro-muscular anomalies detected. Sensory (Neurological): Unimpaired Muscle strength & Tone: No palpable anomalies  Lumbar Spine Area Exam  Skin & Axial Inspection: No masses, redness, or swelling Alignment: Symmetrical Functional ROM: Unrestricted ROM       Stability: No instability detected Muscle  Tone/Strength: Functionally intact. No obvious neuro-muscular anomalies detected. Sensory (Neurological): Unimpaired Palpation: No palpable anomalies       Provocative Tests: Hyperextension/rotation test: deferred today       Lumbar quadrant test (Kemp's test): deferred today       Lateral bending test: deferred today       Patrick's Maneuver: deferred today                   FABER test: deferred today                   S-I anterior distraction/compression test: deferred today         S-I lateral compression test: deferred today         S-I Thigh-thrust test: deferred today  S-I Gaenslen's test: deferred today          Gait & Posture Assessment  Ambulation: Patient ambulates using a cane Gait: Significantly limited. Dependent on assistive device to ambulate Posture: Difficulty standing up straight, due to pain   Lower Extremity Exam    Side: Right lower extremity  Side: Left lower extremity  Stability: No instability observed          Stability: No instability observed          Skin & Extremity Inspection: Skin color, temperature, and hair growth are WNL. No peripheral edema or cyanosis. No masses, redness, swelling, asymmetry, or associated skin lesions. No contractures.  Skin & Extremity Inspection: Skin color, temperature, and hair growth are WNL. No peripheral edema or cyanosis. No masses, redness, swelling, asymmetry, or associated skin lesions. No contractures.  Functional ROM: Decreased ROM for knee joint          Functional ROM: Unrestricted ROM                  Muscle Tone/Strength: Functionally intact. No obvious neuro-muscular anomalies detected.  Muscle Tone/Strength: Functionally intact. No obvious neuro-muscular anomalies detected.  Sensory (Neurological): Arthropathic arthralgia  Sensory (Neurological): Unimpaired  Palpation: Complains of area being tender to palpation  Palpation: No palpable anomalies   Assessment & Plan  Primary Diagnosis & Pertinent Problem  List: The primary encounter diagnosis was Chronic pain syndrome. Diagnoses of Chronic knee pain (Primary Area of Pain) (Right), Osteoarthritis of knee (Right), Chronic lower extremity pain (Secondary Area of Pain) (Right), Chronic foot pain (Tertiary Area of Pain) (Left), and Chronic hip pain (Fourth Area of Pain) (Right) were also pertinent to this visit.  Visit Diagnosis: 1. Chronic pain syndrome   2. Chronic knee pain (Primary Area of Pain) (Right)   3. Osteoarthritis of knee (Right)   4. Chronic lower extremity pain (Secondary Area of Pain) (Right)   5. Chronic foot pain (Tertiary Area of Pain) (Left)   6. Chronic hip pain (Fourth Area of Pain) (Right)    Problems updated and reviewed during this visit: Problem  Chronic knee pain (Primary Area of Pain) (Right)  Chronic lower extremity pain (Secondary Area of Pain) (Right)  Chronic foot pain (Tertiary Area of Pain) (Left)  Chronic hip pain (Fourth Area of Pain) (Right)  Chronic Pain Syndrome  Osteoarthritis of knee (Right)  Long Term Current Use of Opiate Analgesic  Pharmacologic Therapy  Disorder of Skeletal System  Problems Influencing Health Status  Type 2 Diabetes Mellitus With Hypoglycemia Without Coma, With Long-Term Current Use of Insulin ([facility])  Hypothyroidism  Dka (Diabetic Ketoacidoses) ([facility])  Hyperglycemia  Hyperosmolar Non-Ketotic State in Patient With Type 2 Diabetes Mellitus ([facility])   Pre-op Assessment:  Ms. Baity is a 62 y.o. (year old), female patient, seen today for interventional treatment. She  has a past surgical history that includes Total hip arthroplasty (Bilateral) and Total knee arthroplasty (Left). Ms. Lacewell has a current medication list which includes the following prescription(s): alprazolam, blood glucose meter kit and supplies, furosemide, gabapentin, glucose blood, hydrocodone-acetaminophen, insulin aspart, basaglar kwikpen, levothyroxine, metformin, and pramipexole. Her primarily concern today is the Knee  Pain (right); Hip Pain (right); and Foot Pain (left)  Initial Vital Signs:  Pulse/HCG Rate: 81  Temp: 98.3 F (36.8 C) Resp: 18 BP: 130/61 SpO2: 98 %  BMI: Estimated body mass index is 28.34 kg/m as calculated from the following:   Height as of this encounter: _0  (1.6 m).   Weight  as of this encounter: 160 lb (72.6 kg).  Risk Assessment: Allergies: Reviewed. She has No Known Allergies.  Allergy Precautions: None required Coagulopathies: Reviewed. None identified.  Blood-thinner therapy: None at this time Active Infection(s): Reviewed. None identified. Ms. Badour is afebrile  Site Confirmation: Ms. Poehlman was asked to confirm the procedure and laterality before marking the site Procedure checklist: Completed Consent: Before the procedure and under the influence of no sedative(s), amnesic(s), or anxiolytics, the patient was informed of the treatment options, risks and possible complications. To fulfill our ethical and legal obligations, as recommended by the American Medical Association's Code of Ethics, I have informed the patient of my clinical impression; the nature and purpose of the treatment or procedure; the risks, benefits, and possible complications of the intervention; the alternatives, including doing nothing; the risk(s) and benefit(s) of the alternative treatment(s) or procedure(s); and the risk(s) and benefit(s) of doing nothing. The patient was provided information about the general risks and possible complications associated with the procedure. These may include, but are not limited to: failure to achieve desired goals, infection, bleeding, organ or nerve damage, allergic reactions, paralysis, and death. In addition, the patient was informed of those risks and complications associated to the procedure, such as failure to decrease pain; infection; bleeding; organ or nerve damage with subsequent damage to sensory, motor, and/or autonomic systems, resulting in permanent pain,  numbness, and/or weakness of one or several areas of the body; allergic reactions; (i.e.: anaphylactic reaction); and/or death. Furthermore, the patient was informed of those risks and complications associated with the medications. These include, but are not limited to: allergic reactions (i.e.: anaphylactic or anaphylactoid reaction(s)); adrenal axis suppression; blood sugar elevation that in diabetics may result in ketoacidosis or comma; water retention that in patients with history of congestive heart failure may result in shortness of breath, pulmonary edema, and decompensation with resultant heart failure; weight gain; swelling or edema; medication-induced neural toxicity; particulate matter embolism and blood vessel occlusion with resultant organ, and/or nervous system infarction; and/or aseptic necrosis of one or more joints. Finally, the patient was informed that Medicine is not an exact science; therefore, there is also the possibility of unforeseen or unpredictable risks and/or possible complications that may result in a catastrophic outcome. The patient indicated having understood very clearly. We have given the patient no guarantees and we have made no promises. Enough time was given to the patient to ask questions, all of which were answered to the patient's satisfaction. Ms. Troupe has indicated that she wanted to continue with the procedure. Attestation: I, the ordering provider, attest that I have discussed with the patient the benefits, risks, side-effects, alternatives, likelihood of achieving goals, and potential problems during recovery for the procedure that I have provided informed consent. Date  Time: 01/08/2018 10:48 AM  Pre-Procedure Preparation:  Monitoring: As per clinic protocol. Respiration, ETCO2, SpO2, BP, heart rate and rhythm monitor placed and checked for adequate function Safety Precautions: Patient was assessed for positional comfort and pressure points before starting the  procedure. Time-out: I initiated and conducted the "Time-out" before starting the procedure, as per protocol. The patient was asked to participate by confirming the accuracy of the "Time Out" information. Verification of the correct person, site, and procedure were performed and confirmed by me, the nursing staff, and the patient. "Time-out" conducted as per Joint Commission's Universal Protocol (UP.01.01.01). Time: 1325  Description of Procedure:          Position: Sitting Target Area: Knee Joint Approach: Just  above the Lateral tibial plateau, lateral to the infrapatellar tendon. Area Prepped: Entire knee area, from the mid-thigh to the mid-shin. Prepping solution: ChloraPrep (2% chlorhexidine gluconate and 70% isopropyl alcohol) Safety Precautions: Aspiration looking for blood return was conducted prior to all injections. At no point did we inject any substances, as a needle was being advanced. No attempts were made at seeking any paresthesias. Safe injection practices and needle disposal techniques used. Medications properly checked for expiration dates. SDV (single dose vial) medications used. Description of the Procedure: Protocol guidelines were followed. The patient was placed in position over the fluoroscopy table. The target area was identified and the area prepped in the usual manner. Skin & deeper tissues infiltrated with local anesthetic. Appropriate amount of time allowed to pass for local anesthetics to take effect. The procedure needles were then advanced to the target area. Proper needle placement secured. Negative aspiration confirmed. Solution injected in intermittent fashion, asking for systemic symptoms every 0.5cc of injectate. The needles were then removed and the area cleansed, making sure to leave some of the prepping solution back to take advantage of its long term bactericidal properties. Vitals:   01/08/18 1047 01/08/18 1327  BP: 130/61 (!) 142/76  Pulse: 81 75  Resp: 18  16  Temp: 98.3 F (36.8 C)   TempSrc: Oral   SpO2: 98% 99%  Weight: 160 lb (72.6 kg)   Height: _0  (1.6 m)     Start Time: 1325 hrs. End Time: 1327 hrs. Materials:  Needle(s) Type: Regular needle Gauge: 25G Length: 1.5-in Medication(s): Please see orders for medications and dosing details. Imaging Guidance:          Type of Imaging Technique: None used Indication(s): N/A Exposure Time: No patient exposure Contrast: None used. Fluoroscopic Guidance: N/A Ultrasound Guidance: N/A Interpretation: N/A  Antibiotic Prophylaxis:   Anti-infectives (From admission, onward)   None     Indication(s): None identified  Post-operative Assessment:  Post-procedure Vital Signs:  Pulse/HCG Rate: 75  Temp: 98.3 F (36.8 C) Resp: 16 BP: (!) 142/76 SpO2: 99 %  EBL: None  Complications: No immediate post-treatment complications observed by team, or reported by patient.  Note: The patient tolerated the entire procedure well. A repeat set of vitals were taken after the procedure and the patient was kept under observation following institutional policy, for this type of procedure. Post-procedural neurological assessment was performed, showing return to baseline, prior to discharge. The patient was provided with post-procedure discharge instructions, including a section on how to identify potential problems. Should any problems arise concerning this procedure, the patient was given instructions to immediately contact us, at any time, without hesitation. In any case, we plan to contact the patient by telephone for a follow-up status report regarding this interventional procedure.  Comments:  No additional relevant information.  Time Note: Greater than 50% of the 40 minute(s) of face-to-face time spent with Ms. Bergstresser, was spent in counseling/coordination of care regarding: the appropriate use of the pain scale, Ms. Recendiz's primary cause of pain, the results of her recent test(s), the  significance of each one oth the test(s) anomalies and it's corresponding characteristic pain pattern(s), the treatment plan, treatment alternatives, the risks and possible complications of proposed treatment, going over the informed consent, realistic expectations, the goals of pain management (increased in functionality), the need to bring and keep the BMI below 30, the importance of providing [place] with accurate post-procedure information and the need to collect and read the AVS material.  Plan  of Care  Pharmacotherapy (Medications Ordered): Meds ordered this encounter  Medications  . orphenadrine (NORFLEX) injection 60 mg  . ketorolac (TORADOL) injection 60 mg  . lidocaine (PF) (XYLOCAINE) 1 % injection 5 mL  . ropivacaine (PF) 2 mg/mL (0.2%) (NAROPIN) injection 2 mL  . DISCONTD: methylPREDNISolone acetate (DEPO-MEDROL) injection 80 mg  . methylPREDNISolone acetate (DEPO-MEDROL) injection 40 mg    Procedure Orders     KNEE INJECTION Lab Orders  No laboratory test(s) ordered today   Imaging Orders  No imaging studies ordered today   Referral Orders  No referral(s) requested today   Pharmacological management options:  Opioid Analgesics: I will not be prescribing any opioids at this time.  No UDS available. Membrane stabilizer: We have discussed the possibility of optimizing this mode of therapy, if tolerated Muscle relaxant: We have discussed the possibility of a trial NSAID: We have discussed the possibility of a trial Other analgesic(s): To be determined at a later time   Interventional management options: Planned, scheduled, and/or pending:    Diagnostic right intra-articular knee injection #1 with local anesthetic and steroid (today) Therapeutic/palliative Toradol/Norflex 60/60 IM injection today    Considering:   Diagnostic right intra-articular knee injection with local anesthetic and steroids x1 Therapeutic series of 5 right intra-articular knee joint injections with  Hyalgan  Diagnostic right femoral + obturator nerve block  Possible right femoral nerve + obturator nerve RFA    PRN Procedures:   None at this time   Provider-requested follow-up: Return for post-procedure eval (2 wks), w/ Dr. Dossie Arbour.  No future appointments.  Primary Care Physician: Deanna Green, FNP Location: [facility] Note by: Deanna Cola, MD Date: 01/08/2018; Time: 4:50 PM

## 2018-01-09 LAB — SEDIMENTATION RATE: SED RATE: 40 mm/h (ref 0–40)

## 2018-01-09 LAB — COMP. METABOLIC PANEL (12)
A/G RATIO: 1.8 (ref 1.2–2.2)
ALK PHOS: 247 IU/L — AB (ref 39–117)
AST: 33 IU/L (ref 0–40)
Albumin: 4.4 g/dL (ref 3.6–4.8)
BUN/Creatinine Ratio: 19 (ref 12–28)
BUN: 15 mg/dL (ref 8–27)
Bilirubin Total: 0.2 mg/dL (ref 0.0–1.2)
CHLORIDE: 95 mmol/L — AB (ref 96–106)
Calcium: 10.2 mg/dL (ref 8.7–10.3)
Creatinine, Ser: 0.79 mg/dL (ref 0.57–1.00)
GFR calc non Af Amer: 79 mL/min/{1.73_m2} (ref >59)
GFR, EST AFRICAN AMERICAN: 91 mL/min/{1.73_m2} (ref >59)
GLOBULIN, TOTAL: 2.4 g/dL (ref 1.5–4.5)
GLUCOSE: 256 mg/dL — AB (ref 65–99)
POTASSIUM: 5.1 mmol/L (ref 3.5–5.2)
Sodium: 136 mmol/L (ref 134–144)
Total Protein: 6.8 g/dL (ref 6.0–8.5)

## 2018-01-09 LAB — 25-HYDROXY VITAMIN D LCMS D2+D3
25-Hydroxy, Vitamin D-2: <1 ng/mL
25-Hydroxy, Vitamin D-3: 17 ng/mL
25-Hydroxy, Vitamin D: 17 ng/mL — ABNORMAL LOW

## 2018-01-09 LAB — C-REACTIVE PROTEIN: CRP: 2 mg/L (ref 0–10)

## 2018-01-09 LAB — MAGNESIUM: Magnesium: 2 mg/dL (ref 1.6–2.3)

## 2018-01-09 LAB — VITAMIN B12: VITAMIN B 12: 327 pg/mL (ref 232–1245)

## 2018-01-09 NOTE — Telephone Encounter (Signed)
Called Pt to tell her that her Rx was sent to the pharmacy for her to pick up and she said okay.

## 2018-01-10 ENCOUNTER — Telehealth: Payer: Self-pay

## 2018-01-10 ENCOUNTER — Telehealth: Payer: Self-pay | Admitting: Family Medicine

## 2018-01-10 LAB — COMPLIANCE DRUG ANALYSIS, UR

## 2018-01-10 NOTE — Telephone Encounter (Signed)
Patient called wanting to know about her labs, those were reviewed with patient.  She would like to know if she could see Dr Laban Emperor before traveling to [place] on 01/15/18 so that she could get pain medication.  The patient had been worked in d/t increased knee pain for her 2nd visit on 01/08/18 and labs were not back.  Patient had steroid injection to knee as well as toradol/norflex injection.  I did tell patient that Dr Laban Emperor typically would not prescribe sooner than 14 days in an effort to properly assess steroid injection results.  Patient verbalizes u/o information but is interested in being seen sooner because of knee pain and travel plans.

## 2018-01-10 NOTE — Telephone Encounter (Signed)
I am concern about this patient. She has been given several Rx for Norco by her PCP. However no medication showed up in her UDS. There was not illegal substances either. You can ask Dr Dorris Carnes. Before you talk with her  its out of my hands. The UDS is in. I would just be concern giving her Narcotics and she is planning on going to [place] . She has not family there anymore her 13 year son is deceased.

## 2018-01-10 NOTE — Telephone Encounter (Signed)
She currently has no f/up appt scheduled d/t travel plans.

## 2018-01-10 NOTE — Telephone Encounter (Signed)
Patient notified that she would not be receiving any medications at this time.  Instructed patient to read information on the AVS about medications.  Patient states that she has already taken all of the Hydrocodone that she received from [name], FNP on 01-05-18.  Informed patient that she could discuss at her follow up appoinmtment when she returns from [place].

## 2018-01-10 NOTE — Telephone Encounter (Signed)
Called pharmacy to verify that rx had been received. The rx was received on the 01-05-18 and picked up. I informed patient when rx was sent in that Lauren was filling this until she is able to see another provider for this medication. Please advise

## 2018-01-10 NOTE — Telephone Encounter (Unsigned)
Copied from CRM [id number]. Topic: Quick Communication - Rx Refill/Question >> Jan 10, 2018  9:23 AM Crist Infante wrote: Medication: HYDROcodone-acetaminophen (NORCO/VICODIN) 5-325 MG tablet  Pt states someone called her and advised Lauren was calling in this med. Please advise [id number] - [place], [place] - [address] [phone] (Phone) [phone] (Fax)

## 2018-01-10 NOTE — Telephone Encounter (Signed)
Pt wanting to know Lab Results

## 2018-01-12 NOTE — Telephone Encounter (Signed)
Called Pt and told her that the RX that Lauren [name] sent in for the Pt was all she could do, until the Pt sees another provider. Pt was not to happy about it and stated she took all 10 pills in 2 days, went to pain clinic and they gave her meds and injections but its not enough for her knee pain.

## 2018-01-24 ENCOUNTER — Ambulatory Visit: Payer: Medicare Other | Admitting: Pain Medicine

## 2018-03-13 DIAGNOSIS — S59912A Unspecified injury of left forearm, initial encounter: Secondary | ICD-10-CM | POA: Diagnosis not present

## 2018-03-13 DIAGNOSIS — Y92009 Unspecified place in unspecified non-institutional (private) residence as the place of occurrence of the external cause: Secondary | ICD-10-CM | POA: Diagnosis not present

## 2018-03-13 DIAGNOSIS — S52592A Other fractures of lower end of left radius, initial encounter for closed fracture: Secondary | ICD-10-CM | POA: Diagnosis not present

## 2018-03-13 DIAGNOSIS — M25532 Pain in left wrist: Secondary | ICD-10-CM | POA: Diagnosis not present

## 2018-03-13 DIAGNOSIS — R69 Illness, unspecified: Secondary | ICD-10-CM | POA: Diagnosis not present

## 2018-03-13 DIAGNOSIS — W06XXXA Fall from bed, initial encounter: Secondary | ICD-10-CM | POA: Diagnosis not present

## 2018-03-13 DIAGNOSIS — Z79899 Other long term (current) drug therapy: Secondary | ICD-10-CM | POA: Diagnosis not present

## 2018-03-13 DIAGNOSIS — M79632 Pain in left forearm: Secondary | ICD-10-CM | POA: Diagnosis not present

## 2018-03-13 DIAGNOSIS — S6992XA Unspecified injury of left wrist, hand and finger(s), initial encounter: Secondary | ICD-10-CM | POA: Diagnosis not present

## 2018-03-13 DIAGNOSIS — M79642 Pain in left hand: Secondary | ICD-10-CM | POA: Diagnosis not present

## 2018-03-13 DIAGNOSIS — Z794 Long term (current) use of insulin: Secondary | ICD-10-CM | POA: Diagnosis not present

## 2018-03-13 DIAGNOSIS — F1590 Other stimulant use, unspecified, uncomplicated: Secondary | ICD-10-CM | POA: Diagnosis not present

## 2018-03-13 DIAGNOSIS — F1290 Cannabis use, unspecified, uncomplicated: Secondary | ICD-10-CM | POA: Diagnosis not present

## 2018-03-13 DIAGNOSIS — E0590 Thyrotoxicosis, unspecified without thyrotoxic crisis or storm: Secondary | ICD-10-CM | POA: Diagnosis not present

## 2018-03-13 DIAGNOSIS — E1165 Type 2 diabetes mellitus with hyperglycemia: Secondary | ICD-10-CM | POA: Diagnosis not present

## 2018-03-13 DIAGNOSIS — S5292XA Unspecified fracture of left forearm, initial encounter for closed fracture: Secondary | ICD-10-CM | POA: Diagnosis not present

## 2018-03-13 DIAGNOSIS — R739 Hyperglycemia, unspecified: Secondary | ICD-10-CM | POA: Diagnosis not present

## 2018-05-14 DIAGNOSIS — G894 Chronic pain syndrome: Secondary | ICD-10-CM | POA: Diagnosis not present

## 2018-05-14 DIAGNOSIS — R69 Illness, unspecified: Secondary | ICD-10-CM | POA: Diagnosis not present

## 2018-05-14 DIAGNOSIS — M79671 Pain in right foot: Secondary | ICD-10-CM | POA: Diagnosis not present

## 2018-05-14 DIAGNOSIS — E1165 Type 2 diabetes mellitus with hyperglycemia: Secondary | ICD-10-CM | POA: Diagnosis not present

## 2019-11-15 ENCOUNTER — Telehealth: Payer: Self-pay | Admitting: Family Medicine

## 2019-11-15 NOTE — Telephone Encounter (Signed)
-----   Message from Deanna Hartman sent at 11/14/2019 12:41 PM EDT ----- Regarding: remove pcp [name] pt.  Please remove  Thank you

## 2019-11-15 NOTE — Telephone Encounter (Signed)
error 

## 2019-11-15 NOTE — Telephone Encounter (Signed)
PCP removed. 

## 2020-04-10 IMAGING — CR DG HIP (WITH OR WITHOUT PELVIS) 2-3V*R*
4 series · 4 of 4 positions shown · non-contrast
Comparison: 12/19/2017

CLINICAL DATA: Concern over RIGHT hip, concern for dislocation.

EXAM:
DG HIP (WITH OR WITHOUT PELVIS) 2-3V RIGHT

[pelvis ap (1 of 2)]
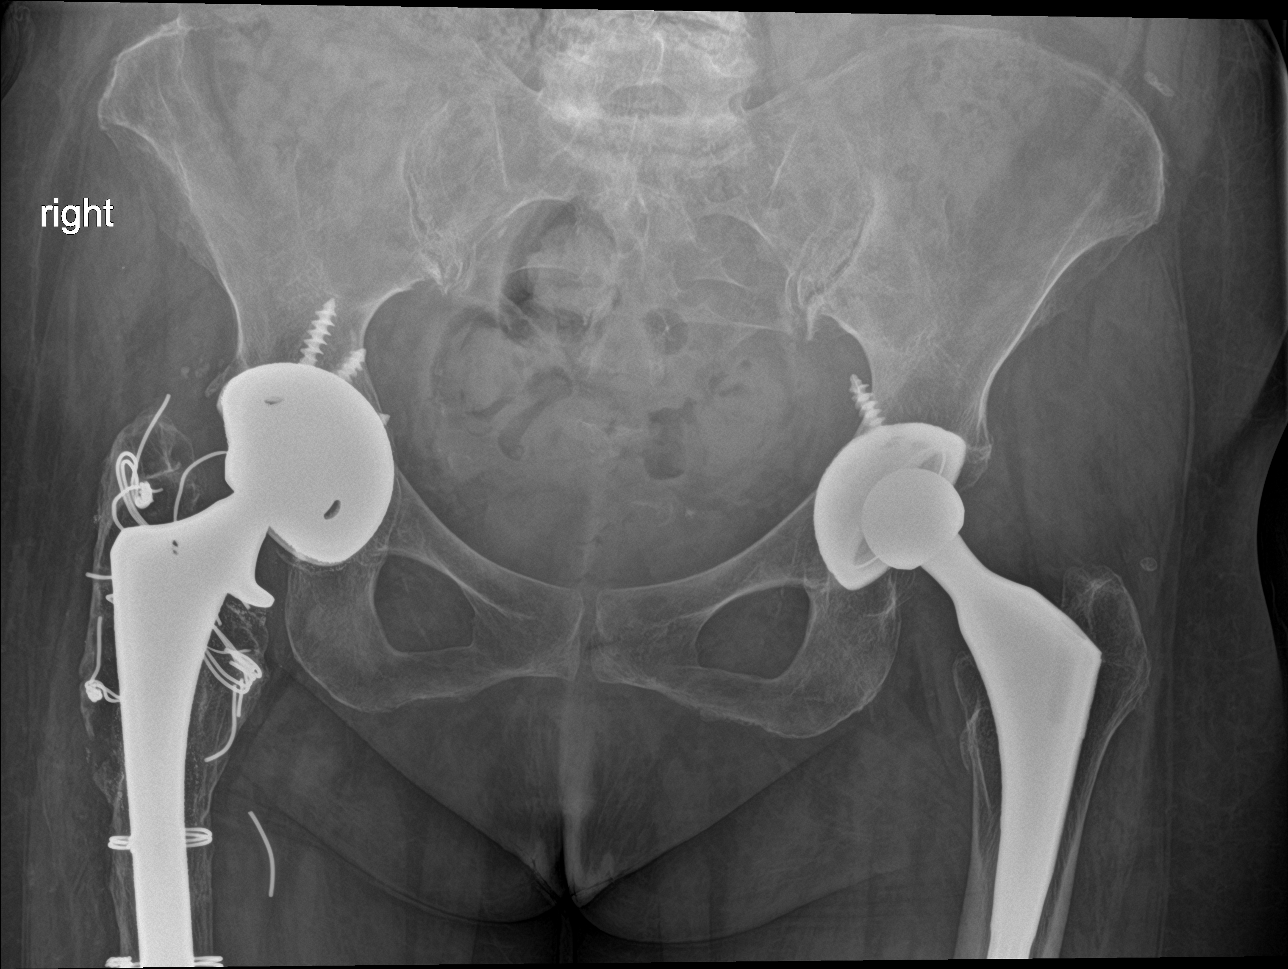

[hip ap]
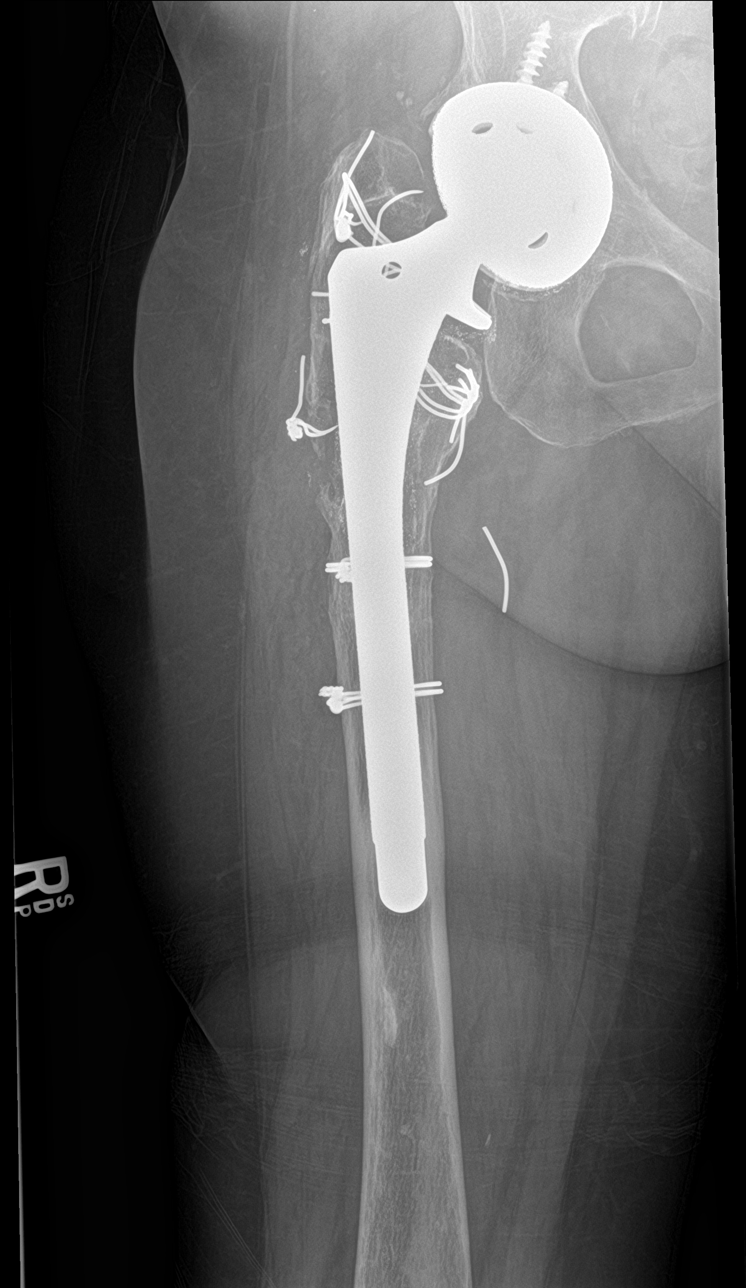

[hip lat]
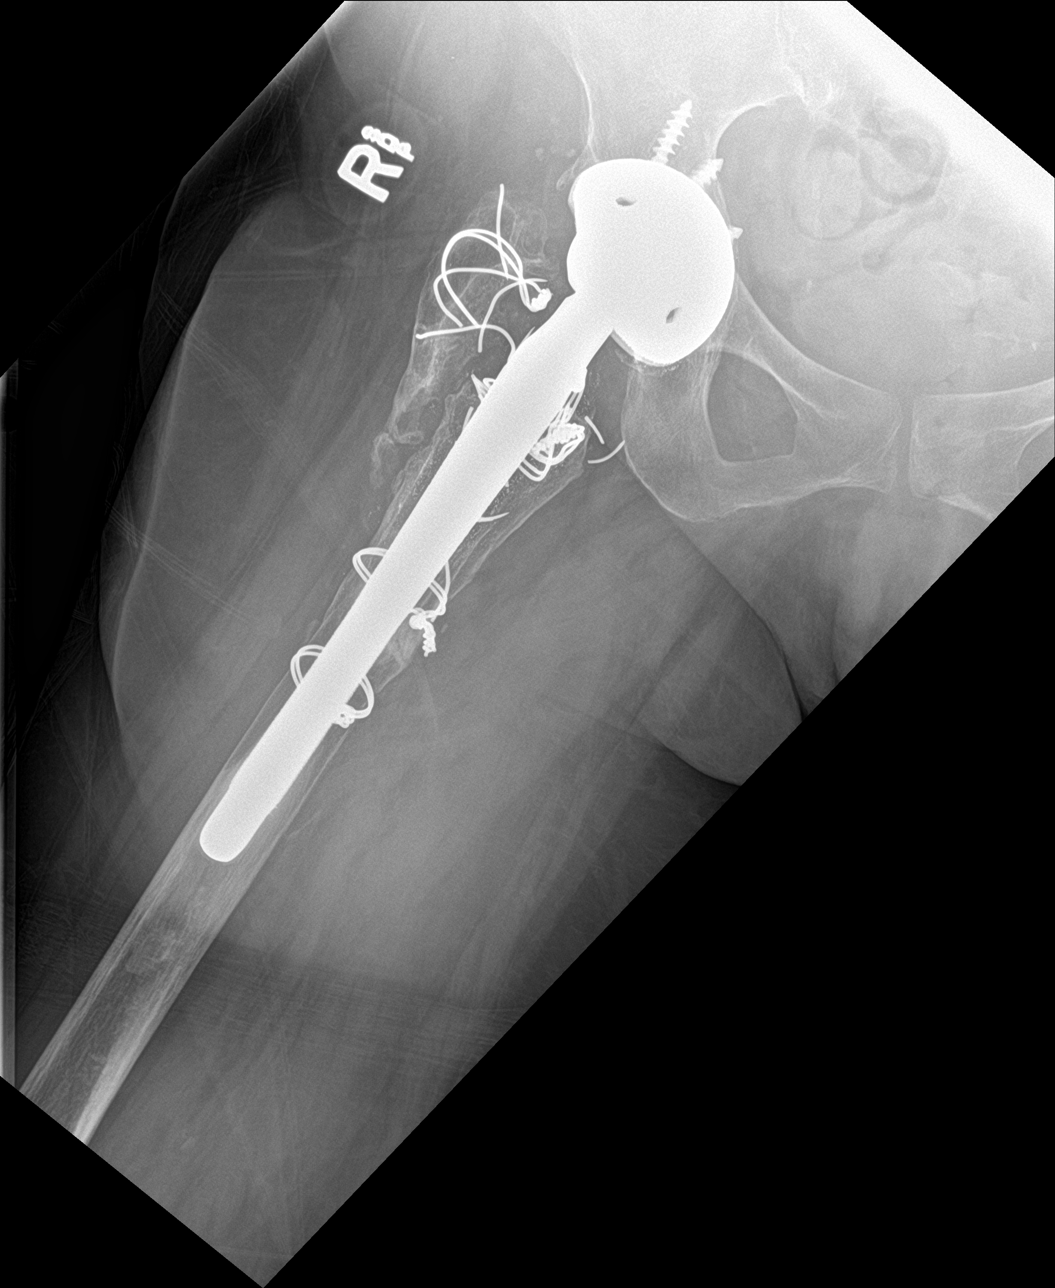

[pelvis ap (2 of 2)]
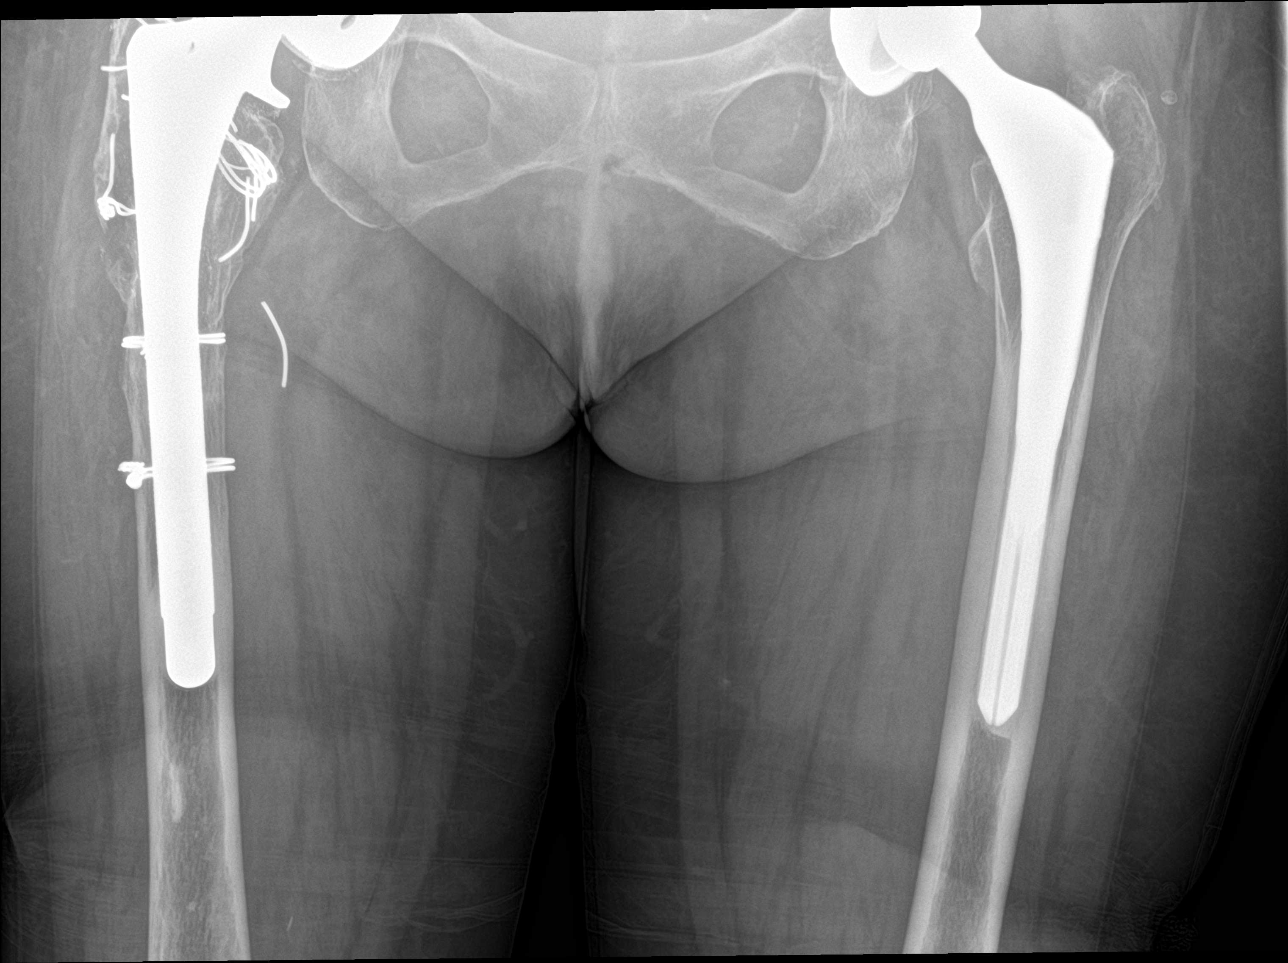

[4 of 4 positions shown; findings below may reference images not displayed]

FINDINGS: There is no evidence of hip fracture or dislocation. There is no
evidence of arthropathy or other focal bone abnormality. BILATERAL
total hip arthroplasties have been performed.

There several fractured cerclage wires, at least 1 of which is in
the medial soft tissues, of the RIGHT thigh, unchanged from priors.
No cerclage wires have been placed on the LEFT.
IMPRESSION: No evidence of hip fracture, dislocation, or loosening. Several
fractured cerclage wires are seen associated with the RIGHT THA.

## 2023-10-17 DEATH — deceased
